# Patient Record
Sex: Female | Born: 1995 | Race: Black or African American | Hispanic: No | Marital: Single | Smoking: Never smoker
Health system: Southern US, Community
[De-identification: ages and names within clinical notes are randomized; demographics above are authoritative.]

## PROBLEM LIST (undated history)

## (undated) DIAGNOSIS — D509 Iron deficiency anemia, unspecified: Secondary | ICD-10-CM

## (undated) HISTORY — PX: CHOLECYSTECTOMY: SHX55

---

## 2018-01-21 ENCOUNTER — Emergency Department (HOSPITAL_COMMUNITY)
Admission: EM | Admit: 2018-01-21 | Discharge: 2018-01-21 | Disposition: A | Discharge status: Home / Self Care | Payer: No Typology Code available for payment source | Attending: Emergency Medicine | Admitting: Emergency Medicine

## 2018-01-21 ENCOUNTER — Emergency Department (HOSPITAL_COMMUNITY): Payer: No Typology Code available for payment source

## 2018-01-21 ENCOUNTER — Encounter (HOSPITAL_COMMUNITY): Payer: Self-pay

## 2018-01-21 ENCOUNTER — Other Ambulatory Visit: Payer: Self-pay

## 2018-01-21 DIAGNOSIS — M549 Dorsalgia, unspecified: Secondary | ICD-10-CM | POA: Diagnosis not present

## 2018-01-21 DIAGNOSIS — M545 Low back pain, unspecified: Secondary | ICD-10-CM

## 2018-01-21 DIAGNOSIS — M542 Cervicalgia: Secondary | ICD-10-CM | POA: Diagnosis present

## 2018-01-21 LAB — POC URINE PREG, ED: Preg Test, Ur: NEGATIVE

## 2018-01-21 MED ORDER — METHOCARBAMOL 500 MG PO TABS: 500.0000 mg | ORAL_TABLET | Freq: Two times a day (BID) | ORAL | 0 refills | Status: DC

## 2018-01-21 MED ORDER — METHOCARBAMOL 500 MG PO TABS
500.0000 mg | ORAL_TABLET | Freq: Once | ORAL | Status: AC
  Administered 2018-01-21: 500 mg via ORAL
  Filled 2018-01-21: qty 1
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
## 2018-01-21 NOTE — Discharge Instructions (Addendum)
All your imaging today was normal.I have prescribed muscle relaxers for your pain, please do not drink or drive while taking this medications as they can make you drowsy. Please follow-up with PCP in 1 week for reevaluation of your symptoms. If you experience any bowel or bladder incontinence, fever, worsening in your symptoms please return to the ED.
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
## 2018-01-28 ENCOUNTER — Encounter (HOSPITAL_COMMUNITY): Payer: Self-pay | Admitting: Emergency Medicine

## 2018-01-28 ENCOUNTER — Ambulatory Visit (HOSPITAL_COMMUNITY)
Admission: EM | Admit: 2018-01-28 | Discharge: 2018-01-28 | Disposition: A | Discharge status: Home / Self Care | Payer: Self-pay | Attending: Family Medicine | Admitting: Family Medicine

## 2018-01-28 DIAGNOSIS — S161XXA Strain of muscle, fascia and tendon at neck level, initial encounter: Secondary | ICD-10-CM

## 2018-01-28 MED ORDER — METHOCARBAMOL 500 MG PO TABS: 500.0000 mg | ORAL_TABLET | Freq: Every evening | ORAL | 0 refills | Status: DC | PRN

## 2018-01-28 MED ORDER — DICLOFENAC SODIUM 75 MG PO TBEC: 75.0000 mg | DELAYED_RELEASE_TABLET | Freq: Two times a day (BID) | ORAL | 0 refills | Status: DC
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
## 2018-02-14 DIAGNOSIS — D509 Iron deficiency anemia, unspecified: Secondary | ICD-10-CM

## 2018-02-14 HISTORY — DX: Iron deficiency anemia, unspecified: D50.9

## 2018-02-22 ENCOUNTER — Observation Stay (HOSPITAL_COMMUNITY)
Admission: EM | Admit: 2018-02-22 | Discharge: 2018-02-24 | Disposition: A | Discharge status: Home / Self Care | Payer: Self-pay | Attending: Internal Medicine | Admitting: Internal Medicine

## 2018-02-22 ENCOUNTER — Encounter (HOSPITAL_COMMUNITY): Payer: Self-pay | Admitting: *Deleted

## 2018-02-22 DIAGNOSIS — N920 Excessive and frequent menstruation with regular cycle: Secondary | ICD-10-CM | POA: Diagnosis present

## 2018-02-22 DIAGNOSIS — R55 Syncope and collapse: Principal | ICD-10-CM | POA: Insufficient documentation

## 2018-02-22 DIAGNOSIS — D500 Iron deficiency anemia secondary to blood loss (chronic): Secondary | ICD-10-CM | POA: Insufficient documentation

## 2018-02-22 DIAGNOSIS — N921 Excessive and frequent menstruation with irregular cycle: Secondary | ICD-10-CM

## 2018-02-22 DIAGNOSIS — Z791 Long term (current) use of non-steroidal anti-inflammatories (NSAID): Secondary | ICD-10-CM | POA: Insufficient documentation

## 2018-02-22 DIAGNOSIS — I4891 Unspecified atrial fibrillation: Secondary | ICD-10-CM | POA: Insufficient documentation

## 2018-02-22 DIAGNOSIS — D509 Iron deficiency anemia, unspecified: Secondary | ICD-10-CM

## 2018-02-22 HISTORY — DX: Iron deficiency anemia, unspecified: D50.9

## 2018-02-22 LAB — CBC
HCT: 22 % — ABNORMAL LOW (ref 36.0–46.0)
HEMOGLOBIN: 5.5 g/dL — AB (ref 12.0–15.0)
MCH: 15.3 pg — ABNORMAL LOW (ref 26.0–34.0)
MCHC: 25 g/dL — ABNORMAL LOW (ref 30.0–36.0)
MCV: 61.3 fL — ABNORMAL LOW (ref 80.0–100.0)
NRBC: 0.3 % — AB (ref 0.0–0.2)
PLATELETS: 290 10*3/uL (ref 150–400)
RBC: 3.59 MIL/uL — AB (ref 3.87–5.11)
RDW: 19.4 % — ABNORMAL HIGH (ref 11.5–15.5)
WBC: 7.3 10*3/uL (ref 4.0–10.5)

## 2018-02-22 LAB — BASIC METABOLIC PANEL
ANION GAP: 9 (ref 5–15)
BUN: 6 mg/dL (ref 6–20)
CALCIUM: 9.2 mg/dL (ref 8.9–10.3)
CO2: 22 mmol/L (ref 22–32)
Chloride: 107 mmol/L (ref 98–111)
Creatinine, Ser: 0.8 mg/dL (ref 0.44–1.00)
Glucose, Bld: 105 mg/dL — ABNORMAL HIGH (ref 70–99)
Potassium: 3.6 mmol/L (ref 3.5–5.1)
SODIUM: 138 mmol/L (ref 135–145)

## 2018-02-22 LAB — RETICULOCYTES
Immature Retic Fract: 17.5 % — ABNORMAL HIGH (ref 2.3–15.9)
RBC.: 3.3 MIL/uL — ABNORMAL LOW (ref 3.87–5.11)
RETIC COUNT ABSOLUTE: 45.2 10*3/uL (ref 19.0–186.0)
Retic Ct Pct: 1.4 % (ref 0.4–3.1)

## 2018-02-22 LAB — FOLATE: Folate: 35.4 ng/mL (ref >5.9)

## 2018-02-22 LAB — IRON AND TIBC
IRON: 8 ug/dL — AB (ref 28–170)
SATURATION RATIOS: 2 % — AB (ref 10.4–31.8)
TIBC: 511 ug/dL — ABNORMAL HIGH (ref 250–450)
UIBC: 503 ug/dL

## 2018-02-22 LAB — PREPARE RBC (CROSSMATCH)

## 2018-02-22 LAB — I-STAT BETA HCG BLOOD, ED (MC, WL, AP ONLY)

## 2018-02-22 LAB — ABO/RH: ABO/RH(D): O POS

## 2018-02-22 LAB — FERRITIN: FERRITIN: 3 ng/mL — AB (ref 11–307)

## 2018-02-22 LAB — VITAMIN B12: Vitamin B-12: 265 pg/mL (ref 180–914)

## 2018-02-22 MED ORDER — ONDANSETRON HCL 4 MG PO TABS: 4.0000 mg | ORAL_TABLET | Freq: Four times a day (QID) | ORAL | Status: DC | PRN

## 2018-02-22 MED ORDER — ACETAMINOPHEN 650 MG RE SUPP: 650.0000 mg | Freq: Four times a day (QID) | RECTAL | Status: DC | PRN

## 2018-02-22 MED ORDER — SODIUM CHLORIDE 0.9 % IV SOLN
10.0000 mL/h | Freq: Once | INTRAVENOUS | Status: AC
  Administered 2018-02-22: 10 mL/h via INTRAVENOUS

## 2018-02-22 MED ORDER — ACETAMINOPHEN 325 MG PO TABS
650.0000 mg | ORAL_TABLET | Freq: Four times a day (QID) | ORAL | Status: DC | PRN
  Administered 2018-02-23 – 2018-02-24 (×2): 650 mg via ORAL
  Filled 2018-02-22 (×2): qty 2

## 2018-02-22 MED ORDER — ONDANSETRON HCL 4 MG/2ML IJ SOLN: 4.0000 mg | Freq: Four times a day (QID) | INTRAMUSCULAR | Status: DC | PRN

## 2018-02-22 NOTE — ED Triage Notes (Signed)
Pt in after syncopal episode, states she has been feeling dizzy with movement for the last few days, today she stood up and walking into a room and passed out, friends say she was unconscious for approx 2 minutes, pt does not remember event but was not confused when she woke up, reports continued intermittent dizziness
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
## 2018-02-22 NOTE — H&P (Signed)
History and Physical    Terry Blackburn ZOX:[id number] DOB: April 19, 1995 DOA: 02/22/2018  PCP: Patient, No Pcp Per  Patient coming from: Home  I have personally briefly reviewed patient's old medical records in [facility]  Chief Complaint: Syncope  HPI: Terry Blackburn is a 22 y.o. female with medical history significant of previously healthy.  Patient presents to the ED with syncope episode that occurred earlier today.   Patient states that she got up and walked out of her bedroom today and blacked out.  She was assisted by a friend who saw her fall.  She had a couple brief episodes of shaking and woke up after about 2 minutes.  She was confused for a brief period of time but quickly came back to her normal self.  Lightheadedness with standing recently, no siezure history, does get SOB with exertion and has had fatigue especially recently.  No melena nor hematochezia.  Does have very heavy periods especially recently, bled for 10 days heavily in month of Nov for example, not currently on period.  Was told she was anemic when giving blood in past but never followed up on this.   ED Course: HGB 5.5, MCV 61.  2u PRBC ordered.   Review of Systems: As per HPI otherwise 10 point review of systems negative.   History reviewed. No pertinent past medical history.  History reviewed. No pertinent surgical history.   reports that she has never smoked. She does not have any smokeless tobacco history on file. She reports that she does not drink alcohol or use drugs.  No Known Allergies  Family History  Problem Relation Age of Onset  . Healthy Mother   . Healthy Father      Prior to Admission medications   Medication Sig Start Date End Date Taking? Authorizing Provider  ibuprofen (ADVIL,MOTRIN) 800 MG tablet Take 800 mg by mouth every 8 (eight) hours as needed (back pain).   Yes [provider]  Multiple Vitamins-Minerals (HAIR/SKIN/NAILS/BIOTIN) TABS Take 1 tablet by mouth  daily.   Yes [provider]    Physical Exam: Vitals:   02/22/18 2000 02/22/18 2115 02/22/18 2131 02/22/18 2146  BP: 118/78 (!) 120/96 108/68 111/67  Pulse: 80 76 77 79  Resp: (!) 25 14 (!) 21 20  Temp:   98.1 F (36.7 C) 98.9 F (37.2 C)  TempSrc:   Oral Oral  SpO2: 100% 100%  100%    Constitutional: NAD, calm, comfortable Eyes: PERRL, lids and conjunctivae normal ENMT: Mucous membranes are moist. Posterior pharynx clear of any exudate or lesions.Normal dentition.  Neck: normal, supple, no masses, no thyromegaly Respiratory: clear to auscultation bilaterally, no wheezing, no crackles. Normal respiratory effort. No accessory muscle use.  Cardiovascular: Regular rate and rhythm, no murmurs / rubs / gallops. No extremity edema. 2+ pedal pulses. No carotid bruits.  Abdomen: no tenderness, no masses palpated. No hepatosplenomegaly. Bowel sounds positive.  Musculoskeletal: no clubbing / cyanosis. No joint deformity upper and lower extremities. Good ROM, no contractures. Normal muscle tone.  Skin: no rashes, lesions, ulcers. No induration Neurologic: CN 2-12 grossly intact. Sensation intact, DTR normal. Strength 5/5 in all 4.  Psychiatric: Normal judgment and insight. Alert and oriented x 3. Normal mood.    Labs on Admission: I have personally reviewed following labs and imaging studies  CBC: Recent Labs  Lab 02/22/18 1842  WBC 7.3  HGB 5.5*  HCT 22.0*  MCV 61.3*  PLT 290   Basic Metabolic Panel: Recent Labs  Lab 02/22/18 1842  NA 138  K 3.6  CL 107  CO2 22  GLUCOSE 105*  BUN 6  CREATININE 0.80  CALCIUM 9.2   GFR: CrCl cannot be calculated (Unknown ideal weight.). Liver Function Tests: No results for input(s): AST, ALT, ALKPHOS, BILITOT, PROT, ALBUMIN in the last 168 hours. No results for input(s): LIPASE, AMYLASE in the last 168 hours. No results for input(s): AMMONIA in the last 168 hours. Coagulation Profile: No results for input(s): INR, PROTIME in  the last 168 hours. Cardiac Enzymes: No results for input(s): CKTOTAL, CKMB, CKMBINDEX, TROPONINI in the last 168 hours. BNP (last 3 results) No results for input(s): PROBNP in the last 8760 hours. HbA1C: No results for input(s): HGBA1C in the last 72 hours. CBG: No results for input(s): GLUCAP in the last 168 hours. Lipid Profile: No results for input(s): CHOL, HDL, LDLCALC, TRIG, CHOLHDL, LDLDIRECT in the last 72 hours. Thyroid Function Tests: No results for input(s): TSH, T4TOTAL, FREET4, T3FREE, THYROIDAB in the last 72 hours. Anemia Panel: Recent Labs    02/22/18 2120  RETICCTPCT 1.4   Urine analysis: No results found for: COLORURINE, APPEARANCEUR, LABSPEC, PHURINE, GLUCOSEU, HGBUR, BILIRUBINUR, KETONESUR, PROTEINUR, UROBILINOGEN, NITRITE, LEUKOCYTESUR  Radiological Exams on Admission: No results found.  EKG: Independently reviewed.  Assessment/Plan Principal Problem:   Syncope Active Problems:   Menorrhagia   Iron deficiency anemia due to chronic blood loss    1. Syncope - suspect this secondary to severe anemia with HGB 5.5 1. Transfuse 2u PRBC 2. Tele monitor 3. No abn heart sounds on my exam, will hold of on ordering 2d echo for the moment given the very high likely hood of syncope relation to anemia. 4. Doubt seizure given above, no neuro deficits, will hold off on ordering CNS imaging as well. 2. Anemia - probably iron def due to chronic blood loss 1. Anemia pnl pending to confirm 2. Needs GYN follow up for menorrhagia  DVT prophylaxis: SCDs Code Status: Full Family Communication: No family in room Disposition Plan: Home after admit Consults called: None Admission status: Place in obs     Carmelle Bamberg, Heywood IlesJARED M. DO [facility] Pager [phone] Only works nights!  If 7AM-7PM, please contact the primary day team physician taking care of patient  [email or web address] Password [email or web address]  02/22/2018, 9:52 PM

## 2018-02-23 ENCOUNTER — Other Ambulatory Visit: Payer: Self-pay

## 2018-02-23 ENCOUNTER — Encounter (HOSPITAL_COMMUNITY): Payer: Self-pay | Admitting: General Practice

## 2018-02-23 DIAGNOSIS — D500 Iron deficiency anemia secondary to blood loss (chronic): Secondary | ICD-10-CM

## 2018-02-23 LAB — PREPARE RBC (CROSSMATCH)

## 2018-02-23 LAB — CBC
HCT: 25.7 % — ABNORMAL LOW (ref 36.0–46.0)
Hemoglobin: 7.1 g/dL — ABNORMAL LOW (ref 12.0–15.0)
MCH: 18.7 pg — ABNORMAL LOW (ref 26.0–34.0)
MCHC: 27.6 g/dL — ABNORMAL LOW (ref 30.0–36.0)
MCV: 67.6 fL — ABNORMAL LOW (ref 80.0–100.0)
Platelets: 232 10*3/uL (ref 150–400)
RBC: 3.8 MIL/uL — ABNORMAL LOW (ref 3.87–5.11)
RDW: 26.9 % — AB (ref 11.5–15.5)
WBC: 6.2 10*3/uL (ref 4.0–10.5)
nRBC: 0.3 % — ABNORMAL HIGH (ref 0.0–0.2)

## 2018-02-23 LAB — HIV ANTIBODY (ROUTINE TESTING W REFLEX): HIV Screen 4th Generation wRfx: NONREACTIVE

## 2018-02-23 LAB — HEMOGLOBIN AND HEMATOCRIT, BLOOD
HCT: 29.4 % — ABNORMAL LOW (ref 36.0–46.0)
HEMATOCRIT: 27.6 % — AB (ref 36.0–46.0)
HEMOGLOBIN: 7.5 g/dL — AB (ref 12.0–15.0)
Hemoglobin: 8.4 g/dL — ABNORMAL LOW (ref 12.0–15.0)

## 2018-02-23 MED ORDER — SODIUM CHLORIDE 0.9 % IV SOLN
510.0000 mg | Freq: Once | INTRAVENOUS | Status: AC
  Administered 2018-02-23: 510 mg via INTRAVENOUS
  Filled 2018-02-23: qty 17

## 2018-02-23 MED ORDER — SODIUM CHLORIDE 0.9% IV SOLUTION
Freq: Once | INTRAVENOUS | Status: AC
  Administered 2018-02-23: 15:00:00 via INTRAVENOUS

## 2018-02-23 NOTE — Progress Notes (Signed)
Progress Note    Terry Blackburn  ZOX:[id number] DOB: 03-16-1996  DOA: 02/22/2018 PCP: Patient, No Pcp Per    Brief Narrative:     Medical records reviewed and are as summarized below:  Terry Blackburn is an 22 y.o. female with medical history significant of previously healthy.  Patient presents to the ED with syncope episode that occurred earlier today.  Patient states that she got up and walked out of her bedroom today and blacked out. She was assisted by a friend who saw her fall. She had a couple brief episodes of shaking and woke up after about 2 minutes. She was confused for a brief period of time but quickly came back to her normal self.  Lightheadedness with standing recently, no siezure history, does get SOB with exertion and has had fatigue especially recently.  No melena nor hematochezia.  Does have very heavy periods especially recently, bled for 10 days heavily in month of Nov for example, not currently on period.  Was told she was anemic when giving blood in past but never followed up on this.  Assessment/Plan:   Principal Problem:   Syncope Active Problems:   Menorrhagia   Iron deficiency anemia due to chronic blood loss  Syncope - suspect this secondary to severe anemia with HGB 5.5 S/p 2 units with improvement to 7.5-- still symptomatic so will give another unit S/p 1 dose of IV Fe -will need PO Fe at d/c  Anemia - probably iron def due to menorrhagia Getting 3rd unit -s/p IV fe -may need megace upon d/c if starts period -has appointment with GYN on 12/20   Family Communication/Anticipated D/C date and plan/Code Status   DVT prophylaxis: scd Code Status: Full Code.  Family Communication:  Disposition Plan: home in AM- getting another PRBC today-- may need megace as due to start period tomorrow   Medical Consultants:    None.    Subjective:   C/o headache and SOB with exertion  Objective:    Vitals:   02/23/18 0500 02/23/18 0515  02/23/18 0816 02/23/18 0949  BP: 103/68 106/61 110/68   Pulse: 74 70 61   Resp: 18 18 20    Temp:   98.1 F (36.7 C)   TempSrc:   Oral   SpO2: 98% 95% 100%   Weight:    83.9 kg  Height:    5\' 8"  (1.727 m)    Intake/Output Summary (Last 24 hours) at 02/23/2018 1350 Last data filed at 02/23/2018 [id number] Gross per 24 hour  Intake 629 ml  Output -  Net 629 ml   Filed Weights   02/23/18 0949  Weight: 83.9 kg    Exam: In bed, NAD rrr No wheezing  Data Reviewed:   I have personally reviewed following labs and imaging studies:  Labs: Labs show the following:   Basic Metabolic Panel: Recent Labs  Lab 02/22/18 1842  NA 138  K 3.6  CL 107  CO2 22  GLUCOSE 105*  BUN 6  CREATININE 0.80  CALCIUM 9.2   GFR Estimated Creatinine Clearance: 126.3 mL/min (by C-G formula based on SCr of 0.8 mg/dL). Liver Function Tests: No results for input(s): AST, ALT, ALKPHOS, BILITOT, PROT, ALBUMIN in the last 168 hours. No results for input(s): LIPASE, AMYLASE in the last 168 hours. No results for input(s): AMMONIA in the last 168 hours. Coagulation profile No results for input(s): INR, PROTIME in the last 168 hours.  CBC: Recent Labs  Lab 02/22/18 1842 02/23/18 0500  02/23/18 1130  WBC 7.3 6.2  --   HGB 5.5* 7.1* 7.5*  HCT 22.0* 25.7* 27.6*  MCV 61.3* 67.6*  --   PLT 290 232  --    Cardiac Enzymes: No results for input(s): CKTOTAL, CKMB, CKMBINDEX, TROPONINI in the last 168 hours. BNP (last 3 results) No results for input(s): PROBNP in the last 8760 hours. CBG: No results for input(s): GLUCAP in the last 168 hours. D-Dimer: No results for input(s): DDIMER in the last 72 hours. Hgb A1c: No results for input(s): HGBA1C in the last 72 hours. Lipid Profile: No results for input(s): CHOL, HDL, LDLCALC, TRIG, CHOLHDL, LDLDIRECT in the last 72 hours. Thyroid function studies: No results for input(s): TSH, T4TOTAL, T3FREE, THYROIDAB in the last 72 hours.  Invalid input(s):  FREET3 Anemia work up: Recent Labs    02/22/18 2120  VITAMINB12 265  FOLATE 35.4  FERRITIN 3*  TIBC 511*  IRON 8*  RETICCTPCT 1.4   Sepsis Labs: Recent Labs  Lab 02/22/18 1842 02/23/18 0500  WBC 7.3 6.2    Microbiology No results found for this or any previous visit (from the past 240 hour(s)).  Procedures and diagnostic studies:  No results found.  Medications:   . sodium chloride   Intravenous Once   Continuous Infusions:   LOS: 0 days   Joseph Art  [facility]   *Please refer to [email or web address], password [email or web address] to get updated schedule on who will round on this patient, as hospitalists switch teams weekly. If 7PM-7AM, please contact night-coverage at [email or web address], password [email or web address] for any overnight needs.  02/23/2018, 1:50 PM

## 2018-02-23 NOTE — Care Management Note (Signed)
Case Management Note  Patient Details  Name: Lashanta Trela MRN: [id number] Date of Birth: 03/27/1995  Subjective/Objective:  22 yo female presented with syncope and severe anemia.                 Action/Plan: CM met with patient to discuss transitional needs. Patient lives at home, independent with ADLs PTA. Patient reports having no established GYN, but agreeable to CM arranging a f/u appointment. Gynecology appt arranged at: [facility] 03/05/18 @ 0915; [facility] for Rx needs and for establishing a local PCP; AVS updated. No further needs from CM.   Expected Discharge Date:  02/24/18               Expected Discharge Plan:  Home/Self Care  In-House Referral:  NA  Discharge planning Services  CM Consult, Follow-up appt scheduled, Medication Assistance  Post Acute Care Choice:  NA Choice offered to:  NA  DME Arranged:  N/A DME Agency:  NA  HH Arranged:  NA HH Agency:  NA  Status of Service:  Completed, signed off  If discussed at Long Length of Stay Meetings, dates discussed:    Additional Comments:  Natalie Gay RN, BSN, NCM-BC, ACM-RN [phone] 02/23/2018, 11:31 AM  

## 2018-02-23 NOTE — Progress Notes (Signed)
Here with anemia from heavy periods-- Fe def Plan: IV fe x 1, H/H at 11 (if still low, may need another PRBC), [facility]

## 2018-02-24 LAB — TYPE AND SCREEN
ABO/RH(D): O POS
Antibody Screen: NEGATIVE
UNIT DIVISION: 0
Unit division: 0
Unit division: 0

## 2018-02-24 LAB — BPAM RBC
BLOOD PRODUCT EXPIRATION DATE: 202001082359
Blood Product Expiration Date: 202001082359
Blood Product Expiration Date: 202001082359
ISSUE DATE / TIME: 201912092121
ISSUE DATE / TIME: 201912100144
ISSUE DATE / TIME: 201912101520
UNIT TYPE AND RH: 5100
Unit Type and Rh: 5100
Unit Type and Rh: 5100

## 2018-02-24 MED ORDER — FERROUS SULFATE 325 (65 FE) MG PO TBEC: 325.0000 mg | DELAYED_RELEASE_TABLET | Freq: Two times a day (BID) | ORAL | 3 refills | Status: DC

## 2018-02-24 NOTE — Discharge Summary (Signed)
Physician Discharge Summary  Terry Blackburn ZOX:[id number] DOB: 1995/06/03 DOA: 02/22/2018  PCP: Patient, No Pcp Per  Admit date: 02/22/2018  Discharge date: 02/24/2018  Admitted From: Home  Disposition:  Home  Discharge Condition: Stable  CODE STATUS:  Full  Brief/Interim Summary:  Terry AlphaKaythelia Bushnell is a 22 y.o. female with no significant past medical history except for vaginal bleeding presented to the hospital with syncope.  Patient got up and walked out of her bedroom today and blacked out and had a witnessed fall.  Patient does meet to have heavy periods especially recently had D&C done outpatient with gynecology.  Patient is a ED patient had a hemoglobin of 5.5.  She received a total of 3 units of packed RBC appropriate rise in her hemoglobin.  She also received 1 dose of IV iron.  Feels much better with 3 units of blood transfusion.  She does have an appointment to follow-up with her gynecology as outpatient and was advised to discuss about need for blood transfusion.  Patient was advised to continue taking iron on discharge.  Patient was finally considered stable for disposition home.  Discharge Diagnoses:  Principal Problem:   Syncope, likely secondary to severe blood loss anemia Active Problems:   Menorrhagia   Iron deficiency anemia due to chronic blood loss   Discharge Instructions  Discharge Instructions    Diet - low sodium heart healthy   Complete by:  As directed    Discharge instructions   Complete by:  As directed    Follow up with Gynecology as scheduled. Continue taking iron as prescribed.   Increase activity slowly   Complete by:  As directed      Allergies as of 02/24/2018   No Known Allergies     Medication List    TAKE these medications   ferrous sulfate 325 (65 FE) MG EC tablet Take 1 tablet (325 mg total) by mouth 2 (two) times daily.   HAIR/SKIN/NAILS/BIOTIN Tabs Take 1 tablet by mouth daily.   ibuprofen 800 MG tablet Commonly known as:   ADVIL,MOTRIN Take 800 mg by mouth every 8 (eight) hours as needed (back pain).      Follow-up Information    [facility]   Why:  at 9:15am for you gynecology appointment. Contact information: [address] [place] [phone]       [facility]. Call.   Why:  for your prescription needs; medications are $4-$10. Contact on Monday to schedule a hospital follow-up appointment. Contact information: [address] [place] [phone]         No Known Allergies  Consultations:  none   Procedures/Studies: Blood transfusion  Subjective:  Denies any dizziness, lightheadedness, shortness of breath,, chest pain or palpitation   Discharge Exam: Vitals:   02/24/18 0045 02/24/18 0643  BP: (!) 102/57 95/69  Pulse: 65 77  Resp: 18 18  Temp: 98.3 F (36.8 C) 97.8 F (36.6 C)  SpO2: 98% 100%   Vitals:   02/23/18 1723 02/23/18 2051 02/24/18 0045 02/24/18 0643  BP: 111/66 100/83 (!) 102/57 95/69  Pulse: 77 66 65 77  Resp: (!) 24 20 18 18   Temp: 98.2 F (36.8 C) 98.4 F (36.9 C) 98.3 F (36.8 C) 97.8 F (36.6 C)  TempSrc: Oral Oral Oral Oral  SpO2: 100% 100% 98% 100%  Weight:      Height:        General:  Pt is alert, awake, not in acute distress.  Mild pallor noted. Cardiovascular: RRR, S1/S2 +, no rubs, no gallops Respiratory: CTA bilaterally, no wheezing, no rhonchi Abdominal: Soft, NT, ND, bowel sounds + CNS: non focal. Extremities: no edema, no cyanosis  The results of significant diagnostics from this hospitalization (including imaging, microbiology, ancillary and laboratory) are listed below for reference.    Microbiology: No results found for this or any previous visit (from the past 240 hour(s)).   Labs: BNP (last 3 results) No results for input(s): BNP in the last 8760 hours. Basic Metabolic Panel: Recent Labs   Lab 02/22/18 1842  NA 138  K 3.6  CL 107  CO2 22  GLUCOSE 105*  BUN 6  CREATININE 0.80  CALCIUM 9.2   Liver Function Tests: No results for input(s): AST, ALT, ALKPHOS, BILITOT, PROT, ALBUMIN in the last 168 hours. No results for input(s): LIPASE, AMYLASE in the last 168 hours. No results for input(s): AMMONIA in the last 168 hours. CBC: Recent Labs  Lab 02/22/18 1842 02/23/18 0500 02/23/18 1130 02/23/18 2110  WBC 7.3 6.2  --   --   HGB 5.5* 7.1* 7.5* 8.4*  HCT 22.0* 25.7* 27.6* 29.4*  MCV 61.3* 67.6*  --   --   PLT 290 232  --   --    Cardiac Enzymes: No results for input(s): CKTOTAL, CKMB, CKMBINDEX, TROPONINI in the last 168 hours. BNP: Invalid input(s): POCBNP CBG: No results for input(s): GLUCAP in the last 168 hours. D-Dimer No results for input(s): DDIMER in the last 72 hours. Hgb A1c No results for input(s): HGBA1C in the last 72 hours. Lipid Profile No results for input(s): CHOL, HDL, LDLCALC, TRIG, CHOLHDL, LDLDIRECT in the last 72 hours. Thyroid function studies No results for input(s): TSH, T4TOTAL, T3FREE, THYROIDAB in the last 72 hours.  Invalid input(s): FREET3 Anemia work up [employer]    02/22/18 2120  VITAMINB12 265  FOLATE 35.4  FERRITIN 3*  TIBC 511*  IRON 8*  RETICCTPCT 1.4   Urinalysis No results found for: COLORURINE, APPEARANCEUR, LABSPEC, PHURINE, GLUCOSEU, HGBUR, BILIRUBINUR, KETONESUR, PROTEINUR, UROBILINOGEN, NITRITE, LEUKOCYTESUR Sepsis Labs Invalid input(s): PROCALCITONIN,  WBC,  LACTICIDVEN Microbiology No results found for this or any previous visit (from the past 240 hour(s)).  Please note: You were cared for by a hospitalist during your hospital stay. Once you are discharged, your primary care physician will handle any further medical issues.   Time coordinating discharge: 40 minutes  SIGNED:  Joycelyn Das, MD  [facility] 02/24/2018, 9:05 AM

## 2018-03-05 ENCOUNTER — Encounter: Payer: Self-pay | Admitting: Obstetrics & Gynecology

## 2018-09-04 ENCOUNTER — Other Ambulatory Visit: Payer: Self-pay

## 2018-09-04 ENCOUNTER — Ambulatory Visit (HOSPITAL_COMMUNITY)
Admission: EM | Admit: 2018-09-04 | Discharge: 2018-09-04 | Disposition: A | Discharge status: Home / Self Care | Payer: Self-pay | Attending: Internal Medicine | Admitting: Internal Medicine

## 2018-09-04 ENCOUNTER — Encounter (HOSPITAL_COMMUNITY): Payer: Self-pay

## 2018-09-04 DIAGNOSIS — N926 Irregular menstruation, unspecified: Secondary | ICD-10-CM

## 2018-09-04 LAB — POCT PREGNANCY, URINE: Preg Test, Ur: NEGATIVE

## 2018-09-04 MED ORDER — MELOXICAM 7.5 MG PO TABS: 7.5000 mg | ORAL_TABLET | Freq: Every day | ORAL | 0 refills | Status: DC

## 2018-09-04 NOTE — ED Provider Notes (Signed)
[facility]    CSN: [id number] Arrival date & time: 09/04/18  1253     History   Chief Complaint Chief Complaint  Patient presents with   excressive bleeding    HPI Terry Blackburn is a 23 y.o. female with history of irregular menses presenting for prolonged menstrual bleeding.  Patient states that she has been seeing [facility]: Receiving Depo-Provera injections through their office.  Patient states her last Depo shot was in May, same time of her last period.  Patient states that she has had spotting since then.  [facility] to her on estradiol, though this gave her migraine so she discontinued it.  Patient states that it was light bleeding during this time, though over the last 3 days she has had heavier bleeding.  States that she has had to change her tampon hourly.  Patient also endorsing breast tenderness and menstrual cramps which patient feels are near her baseline.  Patient takes ibuprofen for her cramps, though has not had as much relief..  Patient does have a history of iron deficiency anemia and was hospitalized in December 2019 for blood transfusion with IV iron.  Patient states she handled blood transfusion well, has not had adverse outcomes or readmission.  She states she was off of her iron tabs for a long time because it upset her stomach, though just recently resumed them.  States that she started last week, currently taking 2-3 times a week.  States despite prolonged/heavy bleeding she has not had any headaches, lightheadedness, presyncopal events, orthostasis, change in vision, weakness, fatigue.  Patient denies easy bruising bleeding, history of coagulopathy, does not routinely take aspirin or NSAIDs.  Patient denies chest pain, palpitations, shortness of breath, abdominal pain, vaginal pain, hematochezia, melena, hematuria, dysuria, urinary frequency.  Patient denies history of fibroids, endometriosis, gynecological malignancy.  Patient states that  she is currently sexually active, does not routinely wear condoms.   Past Medical History:  Diagnosis Date   Iron deficiency anemia 02/2018    Patient Active Problem List   Diagnosis Date Noted   Menorrhagia 02/22/2018   Iron deficiency anemia due to chronic blood loss 02/22/2018   Syncope 02/22/2018    Past Surgical History:  Procedure Laterality Date   CHOLECYSTECTOMY      OB History   No obstetric history on file.      Home Medications    Prior to Admission medications   Medication Sig Start Date End Date Taking? Authorizing Provider  ferrous sulfate 325 (65 FE) MG EC tablet Take 1 tablet (325 mg total) by mouth 2 (two) times daily. 02/24/18 02/24/19  Pokhrel, Rebekah ChesterfieldLaxman, MD  ibuprofen (ADVIL,MOTRIN) 800 MG tablet Take 800 mg by mouth every 8 (eight) hours as needed (back pain).    [provider]  meloxicam (MOBIC) 7.5 MG tablet Take 1 tablet (7.5 mg total) by mouth daily. 09/04/18   Hall-Potvin, [place], PA-C  Multiple Vitamins-Minerals (HAIR/SKIN/NAILS/BIOTIN) TABS Take 1 tablet by mouth daily.    [provider]    Family History Family History  Problem Relation Age of Onset   Healthy Mother    Healthy Father     Social History Social History   Tobacco Use   Smoking status: Never Smoker   Smokeless tobacco: Never Used  Substance Use Topics   Alcohol use: Never    Frequency: Never   Drug use: Never     Allergies   Patient has no known allergies.   Review of Systems As per  HPI   Physical Exam Triage Vital Signs ED Triage Vitals  Enc Vitals Group     BP 09/04/18 1334 126/74     Pulse Rate 09/04/18 1334 69     Resp 09/04/18 1334 16     Temp 09/04/18 1334 98 F (36.7 C)     Temp src --      SpO2 09/04/18 1334 100 %     Weight 09/04/18 1335 190 lb (86.2 kg)     Height --      Head Circumference --      Peak Flow --      Pain Score 09/04/18 1335 10     Pain Loc --      Pain Edu? --      Excl. in GC? --    No  data found.  Updated Vital Signs BP 126/74 (BP Location: Right Arm)    Pulse 69    Temp 98 F (36.7 C)    Resp 16    Wt 190 lb (86.2 kg)    LMP 09/04/2018    SpO2 100%    BMI 28.89 kg/m   Visual Acuity Right Eye Distance:   Left Eye Distance:   Bilateral Distance:    Right Eye Near:   Left Eye Near:    Bilateral Near:     Physical Exam Constitutional:      General: She is not in acute distress. HENT:     Head: Normocephalic and atraumatic.     Mouth/Throat:     Mouth: Mucous membranes are moist.     Pharynx: Oropharynx is clear.  Eyes:     General: No scleral icterus.    Extraocular Movements: Extraocular movements intact.     Conjunctiva/sclera: Conjunctivae normal.     Pupils: Pupils are equal, round, and reactive to light.     Comments: Negative conjunctival pallor  Cardiovascular:     Rate and Rhythm: Normal rate.  Pulmonary:     Effort: Pulmonary effort is normal.  Abdominal:     General: Bowel sounds are normal. There is no distension.     Palpations: Abdomen is soft.     Tenderness: There is no abdominal tenderness. There is no right CVA tenderness, left CVA tenderness or guarding.  Genitourinary:    Comments: Patient declined, self-swab performed Skin:    General: Skin is warm.     Capillary Refill: Capillary refill takes less than 2 seconds.     Coloration: Skin is not jaundiced or pale.     Findings: No bruising or erythema.  Neurological:     General: No focal deficit present.     Mental Status: She is alert and oriented to person, place, and time.  Psychiatric:        Mood and Affect: Mood normal.        Thought Content: Thought content normal.      UC Treatments / Results  Labs (all labs ordered are listed, but only abnormal results are displayed) Labs Reviewed  POC URINE PREG, ED  POCT PREGNANCY, URINE    EKG None  Radiology No results found.  Procedures Procedures (including critical care time)  Medications Ordered in  UC Medications - No data to display  Initial Impression / Assessment and Plan / UC Course  I have reviewed the triage vital signs and the nursing notes.  Pertinent labs & imaging results that were available during my care of the patient were reviewed by me and considered in my medical  decision making (see chart for details).     23 year old female with history of irregular menses and iron deficiency anemia status post blood transfusion in December 2019 presenting for longed vaginal bleeding.  Patient's history and physical exam are reassuring and patient is hemodynamically stable.  Will provide meloxicam for additional cramping relief.  Pregnancy test negative in office.  Discussed importance of routine OB outpatient care as well as health maintenance with PCP.  Discussed case with Lavell Anchors, NP, who is willing to take patient on.  Patient to call Monday to schedule appointment.  Return precautions discussed, patient verbalized understanding. Final Clinical Impressions(s) / UC Diagnoses   Final diagnoses:  Irregular menstrual cycle     Discharge Instructions     Patient to take 1 iron tablet daily for your iron deficiency anemia. If you are unable to tolerate this due to GI upset, try taking once every other day. Follow-up with Lavell Anchors, NP for routine care and further evaluation. Return for evaluation if you become lightheaded, dizzy, have palpitations, shortness of breath, loss of consciousness.    ED Prescriptions    Medication Sig Dispense Auth. Provider   meloxicam (MOBIC) 7.5 MG tablet Take 1 tablet (7.5 mg total) by mouth daily. 10 tablet Hall-Potvin, [place], PA-C     Controlled Substance Prescriptions [place] Controlled Substance Registry consulted? Not Applicable   Quincy Sheehan, [place] 09/05/18 4562

## 2018-09-04 NOTE — ED Triage Notes (Signed)
Pt states she has excressive bleeding pt was given Estradiol because she was having irregular periods.

## 2018-09-04 NOTE — Discharge Instructions (Signed)
Patient to take 1 iron tablet daily for your iron deficiency anemia. If you are unable to tolerate this due to GI upset, try taking once every other day. Follow-up with Lavell Anchors, NP for routine care and further evaluation. Return for evaluation if you become lightheaded, dizzy, have palpitations, shortness of breath, loss of consciousness.

## 2018-09-05 ENCOUNTER — Encounter (HOSPITAL_COMMUNITY): Payer: Self-pay | Admitting: Emergency Medicine

## 2018-10-13 ENCOUNTER — Encounter: Payer: Self-pay | Admitting: Family Medicine

## 2018-10-13 NOTE — Progress Notes (Signed)
Patient did not keep appointment today. She may call to reschedule.  

## 2018-10-14 ENCOUNTER — Telehealth: Payer: Self-pay | Admitting: Hematology

## 2018-10-14 NOTE — Telephone Encounter (Signed)
Received a new hem referral from Dr. Gilford Raid at [facility] for anemia. Pt has been cld and scheduled to see Dr. Irene Limbo on 8/18 at 10am. Ms. Tilmon is aware to arrive 20 minutes early.

## 2018-11-02 ENCOUNTER — Encounter: Payer: Medicaid Other | Admitting: Hematology

## 2018-11-08 ENCOUNTER — Encounter: Payer: Self-pay | Admitting: Emergency Medicine

## 2018-11-08 ENCOUNTER — Other Ambulatory Visit: Payer: Self-pay

## 2018-11-08 ENCOUNTER — Ambulatory Visit
Admission: EM | Admit: 2018-11-08 | Discharge: 2018-11-08 | Disposition: A | Discharge status: Home / Self Care | Payer: Medicaid Other | Attending: Emergency Medicine | Admitting: Emergency Medicine

## 2018-11-08 DIAGNOSIS — B9689 Other specified bacterial agents as the cause of diseases classified elsewhere: Secondary | ICD-10-CM

## 2018-11-08 DIAGNOSIS — N3001 Acute cystitis with hematuria: Secondary | ICD-10-CM | POA: Insufficient documentation

## 2018-11-08 LAB — POCT URINALYSIS DIP (MANUAL ENTRY)
Bilirubin, UA: NEGATIVE
Glucose, UA: NEGATIVE mg/dL
Ketones, POC UA: NEGATIVE mg/dL
Nitrite, UA: NEGATIVE
Protein Ur, POC: NEGATIVE mg/dL
Spec Grav, UA: 1.015 (ref 1.010–1.025)
Urobilinogen, UA: 0.2 E.U./dL
pH, UA: 7 (ref 5.0–8.0)

## 2018-11-08 LAB — POCT URINE PREGNANCY: Preg Test, Ur: NEGATIVE

## 2018-11-08 MED ORDER — CEPHALEXIN 500 MG PO CAPS: 500.0000 mg | ORAL_CAPSULE | Freq: Two times a day (BID) | ORAL | 0 refills | Status: AC

## 2018-11-08 NOTE — ED Triage Notes (Signed)
Pt presents to [facility] for assessment of burning with urination x 4 days, and spotting in the morning x 2 days.  Patient wants to be checked for STDs.

## 2018-11-08 NOTE — Discharge Instructions (Addendum)
Antibiotic as prescribed. We will call you with your STD results if positive, and treat if indicated. Return for worsening symptoms, fever, back or belly pain.

## 2018-11-08 NOTE — ED Notes (Signed)
Patient able to ambulate independently  

## 2018-11-08 NOTE — ED Provider Notes (Signed)
[facility]    CSN: [id number] Arrival date & time: 11/08/18  1919      History   Chief Complaint Chief Complaint  Patient presents with  . Dysuria  . Vaginal Discharge    HPI Terry Blackburn is a 23 y.o. female history of BV presenting for burning with urination for the last 4 days, light vaginal spotting last 2 days.  Patient also requesting STD check: Denies vaginal discharge, pelvic or vaginal pain, anogenital lesions.  Currently sexually active with men, not routinely using condoms.   Past Medical History:  Diagnosis Date  . Iron deficiency anemia 02/2018    Patient Active Problem List   Diagnosis Date Noted  . Menorrhagia 02/22/2018  . Iron deficiency anemia due to chronic blood loss 02/22/2018  . Syncope 02/22/2018    Past Surgical History:  Procedure Laterality Date  . CHOLECYSTECTOMY      OB History   No obstetric history on file.      Home Medications    Prior to Admission medications   Medication Sig Start Date End Date Taking? Authorizing Provider  cephALEXin (KEFLEX) 500 MG capsule Take 1 capsule (500 mg total) by mouth 2 (two) times daily for 3 days. 11/08/18 11/11/18  Hall-Potvin, [place], PA-C  ferrous sulfate 325 (65 FE) MG EC tablet Take 1 tablet (325 mg total) by mouth 2 (two) times daily. 02/24/18 02/24/19  Pokhrel, Corrie Mckusick, MD  ibuprofen (ADVIL,MOTRIN) 800 MG tablet Take 800 mg by mouth every 8 (eight) hours as needed (back pain).    [provider]  Multiple Vitamins-Minerals (HAIR/SKIN/NAILS/BIOTIN) TABS Take 1 tablet by mouth daily.    [provider]    Family History Family History  Problem Relation Age of Onset  . Healthy Mother   . Healthy Father     Social History Social History   Tobacco Use  . Smoking status: Never Smoker  . Smokeless tobacco: Never Used  Substance Use Topics  . Alcohol use: Never    Frequency: Never  . Drug use: Never     Allergies   Patient has no known allergies.    Review of Systems Review of Systems  Constitutional: Negative for fatigue and fever.  Respiratory: Negative for cough and shortness of breath.   Cardiovascular: Negative for chest pain and palpitations.  Gastrointestinal: Negative for abdominal pain, constipation and diarrhea.  Genitourinary: Positive for dysuria and frequency. Negative for flank pain, genital sores, hematuria, pelvic pain, urgency, vaginal bleeding, vaginal discharge and vaginal pain.  Musculoskeletal: Negative for back pain and myalgias.     Physical Exam Triage Vital Signs ED Triage Vitals  Enc Vitals Group     BP 11/08/18 1935 116/81     Pulse Rate 11/08/18 1935 72     Resp 11/08/18 1935 16     Temp 11/08/18 1935 98.9 F (37.2 C)     Temp Source 11/08/18 1935 Oral     SpO2 11/08/18 1935 99 %     Weight --      Height --      Head Circumference --      Peak Flow --      Pain Score 11/08/18 1937 6     Pain Loc --      Pain Edu? --      Excl. in [place]? --    No data found.  Updated Vital Signs BP 116/81 (BP Location: Left Arm)   Pulse 72   Temp 98.9 F (37.2 C) (Oral)  Resp 16   SpO2 99%   Visual Acuity Right Eye Distance:   Left Eye Distance:   Bilateral Distance:    Right Eye Near:   Left Eye Near:    Bilateral Near:     Physical Exam Constitutional:      General: She is not in acute distress. HENT:     Head: Normocephalic and atraumatic.  Eyes:     General: No scleral icterus.    Pupils: Pupils are equal, round, and reactive to light.  Cardiovascular:     Rate and Rhythm: Normal rate.  Pulmonary:     Effort: Pulmonary effort is normal.  Abdominal:     General: Bowel sounds are normal.     Palpations: Abdomen is soft.     Tenderness: There is no abdominal tenderness. There is no right CVA tenderness, left CVA tenderness or guarding.  Genitourinary:    Comments: Patient declined, self-swab performed Skin:    Coloration: Skin is not jaundiced or pale.  Neurological:      Mental Status: She is alert and oriented to person, place, and time.      UC Treatments / Results  Labs (all labs ordered are listed, but only abnormal results are displayed) Labs Reviewed  POCT URINALYSIS DIP (MANUAL ENTRY) - Abnormal; Notable for the following components:      Result Value   Blood, UA small (*)    Leukocytes, UA Trace (*)    All other components within normal limits  POCT URINE PREGNANCY - Normal  URINE CULTURE  CERVICOVAGINAL ANCILLARY ONLY    EKG   Radiology No results found.  Procedures Procedures (including critical care time)  Medications Ordered in UC Medications - No data to display  Initial Impression / Assessment and Plan / UC Course  I have reviewed the triage vital signs and the nursing notes.  Pertinent labs & imaging results that were available during my care of the patient were reviewed by me and considered in my medical decision making (see chart for details).     1.  Acute cystitis with hematuria POCT urinalysis done in office, reviewed by me: Positive for trace leuks, small blood.  Patient to track her menstrual cycle.  Pregnancy test reviewed by me: Negative.  Will treat for simple cystitis with 3-day course of Keflex.  STD panel pending: No in office treatment at this time due to lack of symptoms/known exposure.  Return precautions discussed, patient verbalized understanding and is agreeable to plan. Final Clinical Impressions(s) / UC Diagnoses   Final diagnoses:  Acute cystitis with hematuria     Discharge Instructions     Antibiotic as prescribed. We will call you with your STD results if positive, and treat if indicated. Return for worsening symptoms, fever, back or belly pain.    ED Prescriptions    Medication Sig Dispense Auth. Provider   cephALEXin (KEFLEX) 500 MG capsule Take 1 capsule (500 mg total) by mouth 2 (two) times daily for 3 days. 6 capsule Hall-Potvin, [place], PA-C     Controlled Substance  Prescriptions [place] Controlled Substance Registry consulted? Not Applicable   Shea EvansHall-Potvin, Brittany, [place] JerseyPA-C 11/08/18 2040

## 2018-11-10 LAB — URINE CULTURE: Culture: NO GROWTH

## 2018-11-13 LAB — CERVICOVAGINAL ANCILLARY ONLY
Candida vaginitis: POSITIVE — AB
Chlamydia: NEGATIVE
Neisseria Gonorrhea: NEGATIVE
Trichomonas: NEGATIVE

## 2018-11-15 ENCOUNTER — Telehealth (HOSPITAL_COMMUNITY): Payer: Self-pay | Admitting: Emergency Medicine

## 2018-11-15 MED ORDER — METRONIDAZOLE 500 MG PO TABS: 500.0000 mg | ORAL_TABLET | Freq: Two times a day (BID) | ORAL | 0 refills | Status: AC

## 2018-11-15 MED ORDER — FLUCONAZOLE 150 MG PO TABS: 150.0000 mg | ORAL_TABLET | Freq: Once | ORAL | 0 refills | Status: AC

## 2018-11-15 NOTE — Telephone Encounter (Signed)
Bacterial vaginosis is positive. This was not treated at the urgent care visit.  Flagyl 500 mg BID x 7 days #14 no refills sent to patients pharmacy of choice.    Test for candida (yeast) was positive.  Prescription for fluconazole 150mg po now, repeat dose in 3d if needed, #2 no refills, sent to the pharmacy of record.  Recheck or followup with PCP for further evaluation if symptoms are not improving.    Patient contacted and made aware of    results, all questions answered   

## 2019-03-18 ENCOUNTER — Ambulatory Visit
Admission: EM | Admit: 2019-03-18 | Discharge: 2019-03-18 | Disposition: A | Discharge status: Home / Self Care | Payer: Medicaid Other | Attending: Emergency Medicine | Admitting: Emergency Medicine

## 2019-03-18 ENCOUNTER — Encounter: Payer: Self-pay | Admitting: Emergency Medicine

## 2019-03-18 ENCOUNTER — Other Ambulatory Visit: Payer: Self-pay

## 2019-03-18 DIAGNOSIS — Z3202 Encounter for pregnancy test, result negative: Secondary | ICD-10-CM

## 2019-03-18 DIAGNOSIS — N921 Excessive and frequent menstruation with irregular cycle: Secondary | ICD-10-CM

## 2019-03-18 LAB — POCT URINE PREGNANCY: Preg Test, Ur: NEGATIVE

## 2019-03-18 NOTE — Discharge Instructions (Signed)
Keep GYN appointment - make list of questions to ask. Keep symptom/period log moving forward & bring to GYN appointment. Return sooner for discharge, pain, fever.

## 2019-03-18 NOTE — ED Provider Notes (Signed)
[facility]    CSN: [id number] Arrival date & time: 03/18/19  1117      History   Chief Complaint No chief complaint on file.   HPI Jynesis Nakamura is a 24 y.o. female w/ history of BV presenting for brownish discharge x 2 weeks.  States that she works at [employer], where she is on her feet.  States that when she is at work she will have mild bleeding "like my period"and then when she is at home, reclined/sitting it will turn "brownish and drier".  Patient reporting currently taking OCPs, though does not occasionally miss doses.  Currently sexually active not routinely using condoms.  Denies vaginal discharge, irritation, pain, pelvic pain, abdominal pain, fever.  Has not tried thing for symptoms as "I do not really feel anything".  LMP 11/03/2018.  Denies personal history of endometriosis, fibroids.  Has GYN appointment 1/13: Intends to keep.   Past Medical History:  Diagnosis Date  . Iron deficiency anemia 02/2018    Patient Active Problem List   Diagnosis Date Noted  . Menorrhagia 02/22/2018  . Iron deficiency anemia due to chronic blood loss 02/22/2018  . Syncope 02/22/2018    Past Surgical History:  Procedure Laterality Date  . CHOLECYSTECTOMY      OB History   No obstetric history on file.      Home Medications    Prior to Admission medications   Medication Sig Start Date End Date Taking? Authorizing Provider  ferrous sulfate 325 (65 FE) MG EC tablet Take 1 tablet (325 mg total) by mouth 2 (two) times daily. 02/24/18 02/24/19  Pokhrel, Rebekah [place], MD  ibuprofen (ADVIL,MOTRIN) 800 MG tablet Take 800 mg by mouth every 8 (eight) hours as needed (back pain).    [provider]  Multiple Vitamins-Minerals (HAIR/SKIN/NAILS/BIOTIN) TABS Take 1 tablet by mouth daily.    [provider]    Family History Family History  Problem Relation Age of Onset  . Healthy Mother   . Healthy Father     Social History Social History   Tobacco Use  .  Smoking status: Never Smoker  . Smokeless tobacco: Never Used  Substance Use Topics  . Alcohol use: Never  . Drug use: Never     Allergies   Patient has no known allergies.   Review of Systems Review of Systems  Constitutional: Negative for fatigue and fever.  Respiratory: Negative for cough and shortness of breath.   Cardiovascular: Negative for chest pain and palpitations.  Gastrointestinal: Negative for constipation and diarrhea.  Genitourinary: Positive for menstrual problem. Negative for dysuria, flank pain, frequency, genital sores, hematuria, pelvic pain, urgency, vaginal bleeding, vaginal discharge and vaginal pain.  Hematological: Does not bruise/bleed easily.     Physical Exam Triage Vital Signs ED Triage Vitals  Enc Vitals Group     BP 03/18/19 1133 115/78     Pulse Rate 03/18/19 1133 76     Resp 03/18/19 1133 16     Temp 03/18/19 1133 98.2 F (36.8 C)     Temp Source 03/18/19 1133 Oral     SpO2 03/18/19 1133 97 %     Weight --      Height --      Head Circumference --      Peak Flow --      Pain Score 03/18/19 1140 0     Pain Loc --      Pain Edu? --      Excl. in [place]? --  No data found.  Updated Vital Signs BP 115/78 (BP Location: Left Arm)   Pulse 76   Temp 98.2 F (36.8 C) (Oral)   Resp 16   LMP 11/04/2018   SpO2 97%   Visual Acuity Right Eye Distance:   Left Eye Distance:   Bilateral Distance:    Right Eye Near:   Left Eye Near:    Bilateral Near:     Physical Exam Constitutional:      General: She is not in acute distress. HENT:     Head: Normocephalic and atraumatic.  Eyes:     General: No scleral icterus.    Pupils: Pupils are equal, round, and reactive to light.  Cardiovascular:     Rate and Rhythm: Normal rate.  Pulmonary:     Effort: Pulmonary effort is normal.  Abdominal:     General: Bowel sounds are normal.     Palpations: Abdomen is soft.     Tenderness: There is no abdominal tenderness. There is no right CVA  tenderness, left CVA tenderness or guarding.  Skin:    Coloration: Skin is not jaundiced or pale.  Neurological:     Mental Status: She is alert and oriented to person, place, and time.      UC Treatments / Results  Labs (all labs ordered are listed, but only abnormal results are displayed) Labs Reviewed  POCT URINE PREGNANCY - Normal    EKG   Radiology No results found.  Procedures Procedures (including critical care time)  Medications Ordered in UC Medications - No data to display  Initial Impression / Assessment and Plan / UC Course  I have reviewed the triage vital signs and the nursing notes.  Pertinent labs & imaging results that were available during my care of the patient were reviewed by me and considered in my medical decision making (see chart for details).     Urine pregnancy negative.  H&P consistent with breakthrough bleeding on OCPs.  Recommended secondary contraception method if missing doses.  Encourage symptoms/period log as outlined below.  Patient declined STD check today.  Return precautions discussed, patient verbalized understanding and is agreeable to plan.   Final Clinical Impressions(s) / UC Diagnoses   Final diagnoses:  Breakthrough bleeding on birth control pills     Discharge Instructions     Keep GYN appointment - make list of questions to ask. Keep symptom/period log moving forward & bring to GYN appointment. Return sooner for discharge, pain, fever.    ED Prescriptions    None     PDMP not reviewed this encounter.   Hall-Potvin, [place], [place] 03/18/19 1256

## 2019-03-18 NOTE — ED Triage Notes (Signed)
Pt presents to [facility] for assessment of vaginal discharge starting 2 weeks ago.  Patient states she takes the AZO cranberry tablets twice a day every day.  States she is not sure if that impacted.  First week states she had some red (blood-like) discharge when she was up and physical at work.  States it has turned brown x 1 week.  Pt c/o intermittent lower abdominal cramping, but denies at this time.  Denies burning with urination, denies frequent urination.

## 2019-07-18 ENCOUNTER — Ambulatory Visit
Admission: EM | Admit: 2019-07-18 | Discharge: 2019-07-18 | Disposition: A | Discharge status: Home / Self Care | Payer: Medicaid Other | Attending: Emergency Medicine | Admitting: Emergency Medicine

## 2019-07-18 DIAGNOSIS — N760 Acute vaginitis: Secondary | ICD-10-CM | POA: Insufficient documentation

## 2019-07-18 MED ORDER — FLUCONAZOLE 150 MG PO TABS: 150.0000 mg | ORAL_TABLET | Freq: Every day | ORAL | 0 refills | Status: DC

## 2019-07-18 NOTE — ED Provider Notes (Signed)
[facility]    CSN: [id number] Arrival date & time: 07/18/19  1802      History   Chief Complaint Chief Complaint  Patient presents with  . vaginal irritation    HPI Terry Blackburn is a 24 y.o. female presenting for vaginal irritation for the last 2 days.  Patient states that she had some burning with urination and vaginal discharge on Thursday, though this started to resolve.  States she started her cycle Friday which preceded irritation.  Patient denying discharge, pelvic pain, abdominal pain, back pain.  No urinary frequency, urgency.  No fever.  Has not taken anything for this.   Past Medical History:  Diagnosis Date  . Iron deficiency anemia 02/2018    Patient Active Problem List   Diagnosis Date Noted  . Menorrhagia 02/22/2018  . Iron deficiency anemia due to chronic blood loss 02/22/2018  . Syncope 02/22/2018    Past Surgical History:  Procedure Laterality Date  . CHOLECYSTECTOMY      OB History   No obstetric history on file.      Home Medications    Prior to Admission medications   Medication Sig Start Date End Date Taking? Authorizing Provider  ferrous sulfate 325 (65 FE) MG EC tablet Take 1 tablet (325 mg total) by mouth 2 (two) times daily. 02/24/18 02/24/19  Pokhrel, Corrie Mckusick, MD  fluconazole (DIFLUCAN) 150 MG tablet Take 1 tablet (150 mg total) by mouth daily. May repeat in 72 hours if needed 07/18/19   Hall-Potvin, [place], PA-C  ibuprofen (ADVIL,MOTRIN) 800 MG tablet Take 800 mg by mouth every 8 (eight) hours as needed (back pain).    [provider]  Multiple Vitamins-Minerals (HAIR/SKIN/NAILS/BIOTIN) TABS Take 1 tablet by mouth daily.    [provider]    Family History Family History  Problem Relation Age of Onset  . Healthy Mother   . Healthy Father     Social History Social History   Tobacco Use  . Smoking status: Never Smoker  . Smokeless tobacco: Never Used  Substance Use Topics  . Alcohol use:  Never  . Drug use: Never     Allergies   Patient has no known allergies.   Review of Systems As per HPI   Physical Exam Triage Vital Signs ED Triage Vitals  Enc Vitals Group     BP      Pulse      Resp      Temp      Temp src      SpO2      Weight      Height      Head Circumference      Peak Flow      Pain Score      Pain Loc      Pain Edu?      Excl. in [place]?    No data found.  Updated Vital Signs BP (!) 144/82 (BP Location: Left Arm)   Pulse 75   Temp 98.2 F (36.8 C) (Oral)   Resp 16   LMP 07/15/2019   SpO2 100%   Visual Acuity Right Eye Distance:   Left Eye Distance:   Bilateral Distance:    Right Eye Near:   Left Eye Near:    Bilateral Near:     Physical Exam Constitutional:      General: She is not in acute distress. HENT:     Head: Normocephalic and atraumatic.  Eyes:     General: No scleral  icterus.    Pupils: Pupils are equal, round, and reactive to light.  Cardiovascular:     Rate and Rhythm: Normal rate.  Pulmonary:     Effort: Pulmonary effort is normal.  Abdominal:     General: Bowel sounds are normal.     Palpations: Abdomen is soft.     Tenderness: There is no abdominal tenderness. There is no right CVA tenderness, left CVA tenderness or guarding.  Genitourinary:    Comments: Patient declined, self-swab performed Skin:    Coloration: Skin is not jaundiced or pale.  Neurological:     Mental Status: She is alert and oriented to person, place, and time.      UC Treatments / Results  Labs (all labs ordered are listed, but only abnormal results are displayed) Labs Reviewed  CERVICOVAGINAL ANCILLARY ONLY    EKG   Radiology No results found.  Procedures Procedures (including critical care time)  Medications Ordered in UC Medications - No data to display  Initial Impression / Assessment and Plan / UC Course  I have reviewed the triage vital signs and the nursing notes.  Pertinent labs & imaging results that were  available during my care of the patient were reviewed by me and considered in my medical decision making (see chart for details).     Patient afebrile, nontoxic in office today.  Cytology pending: We will cover for yeast vaginitis as outlined below, treat additionally if indicated.  Return precautions discussed, patient verbalized understanding and is agreeable to plan. Final Clinical Impressions(s) / UC Diagnoses   Final diagnoses:  Acute vaginitis     Discharge Instructions     Testing for chlamydia, gonorrhea, trichomonas is pending: please look for these results on the MyChart app/website.  We will notify you if you are positive and outline treatment at that time.  Important to avoid all forms of sexual intercourse (oral, vaginal, anal) with any/all partners for the next 7 days to avoid spreading/reinfecting. Any/all sexual partners should be notified of testing/treatment today.  Return for persistent/worsening symptoms or if you develop fever, abdominal or pelvic pain, blood in your urine, or are re-exposed to an STI.    ED Prescriptions    Medication Sig Dispense Auth. Provider   fluconazole (DIFLUCAN) 150 MG tablet Take 1 tablet (150 mg total) by mouth daily. May repeat in 72 hours if needed 2 tablet Hall-Potvin, [place], PA-C     PDMP not reviewed this encounter.   Odette Fraction [place], [place] 07/19/19 1954

## 2019-07-18 NOTE — ED Triage Notes (Signed)
Pt states had urinary burning and vaginal discharge last Thursday that subsided when she started her menstrual cycle on Friday, now having vaginal irritation.

## 2019-07-18 NOTE — Discharge Instructions (Signed)

## 2019-07-20 ENCOUNTER — Telehealth: Payer: Self-pay

## 2019-07-20 DIAGNOSIS — N760 Acute vaginitis: Secondary | ICD-10-CM

## 2019-07-20 LAB — CERVICOVAGINAL ANCILLARY ONLY
Bacterial Vaginitis (gardnerella): POSITIVE — AB
Chlamydia: NEGATIVE
Comment: NEGATIVE
Comment: NEGATIVE
Comment: NEGATIVE
Comment: NORMAL
Neisseria Gonorrhea: NEGATIVE
Trichomonas: NEGATIVE

## 2019-07-20 MED ORDER — METRONIDAZOLE 500 MG PO TABS: 500.0000 mg | ORAL_TABLET | Freq: Two times a day (BID) | ORAL | 0 refills | Status: DC

## 2019-07-20 NOTE — Telephone Encounter (Signed)
Pt positive for BV.  Rx for Flagyl sent to pharmacy of record.  Attempted to contact pt.  No answer, no v/m set up.

## 2019-07-21 ENCOUNTER — Telehealth (HOSPITAL_COMMUNITY): Payer: Self-pay | Admitting: Physician Assistant

## 2019-07-21 DIAGNOSIS — N760 Acute vaginitis: Secondary | ICD-10-CM

## 2019-07-21 MED ORDER — METRONIDAZOLE 500 MG PO TABS: 500.0000 mg | ORAL_TABLET | Freq: Two times a day (BID) | ORAL | 0 refills | Status: DC

## 2019-07-21 NOTE — Telephone Encounter (Signed)
Made aware of BV script, requests that it be sent to [facility]
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
## 2019-11-11 ENCOUNTER — Ambulatory Visit
Admission: EM | Admit: 2019-11-11 | Discharge: 2019-11-11 | Disposition: A | Discharge status: Home / Self Care | Payer: Self-pay | Attending: Emergency Medicine | Admitting: Emergency Medicine

## 2019-11-11 DIAGNOSIS — M545 Low back pain, unspecified: Secondary | ICD-10-CM

## 2019-11-11 MED ORDER — CYCLOBENZAPRINE HCL 5 MG PO TABS: 5.0000 mg | ORAL_TABLET | Freq: Two times a day (BID) | ORAL | 0 refills | Status: AC | PRN

## 2019-11-11 MED ORDER — IBUPROFEN 800 MG PO TABS: 800.0000 mg | ORAL_TABLET | Freq: Three times a day (TID) | ORAL | 0 refills | Status: DC | PRN
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
## 2019-11-11 NOTE — Discharge Instructions (Addendum)
RICE: rest, ice, compression, elevation as needed for pain.   Cold therapy (ice packs) can be used to help swelling both after injury and after prolonged use of areas of chronic pain/aches.  Pain medication:  350 mg-1000 mg of Tylenol (acetaminophen) and/or 200 mg - 800 mg of Advil (ibuprofen, Motrin) every 8 hours as needed.  May alternate between the two throughout the day as they are generally safe to take together.  DO NOT exceed more than 3000 mg of Tylenol or 3200 mg of ibuprofen in a 24 hour period as this could damage your stomach, kidneys, liver, or increase your bleeding risk.  May take muscle relaxer as needed for severe pain / spasm.  (This medication may cause you to become tired so it is important you do not drink alcohol or operate heavy machinery while on this medication.  Recommend your first dose to be taken before bedtime to monitor for side effects safely)  Important to follow up with specialist(s) below for further evaluation/management if your symptoms persist or worsen. 
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
## 2020-01-30 ENCOUNTER — Emergency Department (INDEPENDENT_AMBULATORY_CARE_PROVIDER_SITE_OTHER)
Admission: EM | Admit: 2020-01-30 | Discharge: 2020-01-30 | Disposition: A | Discharge status: Home / Self Care | Payer: Self-pay | Source: Home / Self Care

## 2020-01-30 ENCOUNTER — Other Ambulatory Visit: Payer: Self-pay

## 2020-01-30 DIAGNOSIS — S61411A Laceration without foreign body of right hand, initial encounter: Secondary | ICD-10-CM

## 2020-01-30 DIAGNOSIS — Z23 Encounter for immunization: Secondary | ICD-10-CM

## 2020-01-30 MED ORDER — TETANUS-DIPHTH-ACELL PERTUSSIS 5-2.5-18.5 LF-MCG/0.5 IM SUSY
0.5000 mL | PREFILLED_SYRINGE | Freq: Once | INTRAMUSCULAR | Status: AC
  Administered 2020-01-30: 0.5 mL via INTRAMUSCULAR

## 2020-01-30 NOTE — Discharge Instructions (Signed)
  You may gently clean with wound with warm water and soap. Pat dry, do not rub or pick at the wound. You can change the bandage at least twice daily, more often if it becomes wet or dirty.   Follow up in 4-5 days if not improving, follow up sooner if symptoms worsening- redness, swelling, drainage of pus, or fever.

## 2020-01-30 NOTE — ED Provider Notes (Signed)
[facility]    CSN: [id number] Arrival date & time: 01/30/20  1901      History   Chief Complaint Chief Complaint  Patient presents with  . Laceration    RT hand    HPI Terry Blackburn is a 24 y.o. female.   HPI  Terry Blackburn is a 24 y.o. female presenting to UC with c/o laceration to her Right hand while at work around 11-12PM.  She cleaned the wound with peroxide and applied wound glue.  Bleeding controlled PTA.  Pt unsure of her last tetanus.    Past Medical History:  Diagnosis Date  . Iron deficiency anemia 02/2018    Patient Active Problem List   Diagnosis Date Noted  . Menorrhagia 02/22/2018  . Iron deficiency anemia due to chronic blood loss 02/22/2018  . Syncope 02/22/2018    Past Surgical History:  Procedure Laterality Date  . CHOLECYSTECTOMY      OB History   No obstetric history on file.      Home Medications    Prior to Admission medications   Medication Sig Start Date End Date Taking? Authorizing Provider  ferrous sulfate 325 (65 FE) MG EC tablet Take 1 tablet (325 mg total) by mouth 2 (two) times daily. 02/24/18 02/24/19  Pokhrel, Rebekah [place], MD  fluconazole (DIFLUCAN) 150 MG tablet Take 1 tablet (150 mg total) by mouth daily. May repeat in 72 hours if needed 07/18/19   Hall-Potvin, [place], PA-C  ibuprofen (ADVIL) 800 MG tablet Take 1 tablet (800 mg total) by mouth every 8 (eight) hours as needed (back pain). 11/11/19   Hall-Potvin, [place], PA-C  metroNIDAZOLE (FLAGYL) 500 MG tablet Take 1 tablet (500 mg total) by mouth 2 (two) times daily. 07/21/19   Cathie Hoops, Amy V, PA-C  Multiple Vitamins-Minerals (HAIR/SKIN/NAILS/BIOTIN) TABS Take 1 tablet by mouth daily.    [provider]    Family History Family History  Problem Relation Age of Onset  . Healthy Mother   . Healthy Father     Social History Social History   Tobacco Use  . Smoking status: Never Smoker  . Smokeless tobacco: Never Used  Vaping Use  . Vaping Use:  Never used  Substance Use Topics  . Alcohol use: Never  . Drug use: Never     Allergies   Patient has no known allergies.   Review of Systems Review of Systems  Musculoskeletal: Negative for arthralgias and joint swelling.  Skin: Positive for wound. Negative for color change.     Physical Exam Triage Vital Signs ED Triage Vitals  Enc Vitals Group     BP 01/30/20 1910 125/75     Pulse Rate 01/30/20 1910 73     Resp 01/30/20 1910 18     Temp 01/30/20 1910 98.1 F (36.7 C)     Temp Source 01/30/20 1910 Oral     SpO2 01/30/20 1910 100 %     Weight --      Height --      Head Circumference --      Peak Flow --      Pain Score 01/30/20 1912 5     Pain Loc --      Pain Edu? --      Excl. in [place]? --    No data found.  Updated Vital Signs BP 125/75 (BP Location: Left Arm)   Pulse 73   Temp 98.1 F (36.7 C) (Oral)   Resp 18   SpO2 100%   Visual  Acuity Right Eye Distance:   Left Eye Distance:   Bilateral Distance:    Right Eye Near:   Left Eye Near:    Bilateral Near:     Physical Exam Vitals and nursing note reviewed.  Constitutional:      Appearance: Normal appearance. She is well-developed.  HENT:     Head: Normocephalic and atraumatic.  Cardiovascular:     Rate and Rhythm: Normal rate and regular rhythm.     Pulses:          Radial pulses are 2+ on the right side.  Pulmonary:     Effort: Pulmonary effort is normal.  Musculoskeletal:        General: Normal range of motion.       Hands:     Cervical back: Normal range of motion.  Skin:    General: Skin is warm and dry.     Capillary Refill: Capillary refill takes less than 2 seconds.  Neurological:     Mental Status: She is alert and oriented to person, place, and time.     Sensory: No sensory deficit.  Psychiatric:        Behavior: Behavior normal.      UC Treatments / Results  Labs (all labs ordered are listed, but only abnormal results are displayed) Labs Reviewed - No data to display   EKG   Radiology No results found.  Procedures Procedures (including critical care time)  Medications Ordered in UC Medications  Tdap (BOOSTRIX) injection 0.5 mL (0.5 mLs Intramuscular Given 01/30/20 1917)    Initial Impression / Assessment and Plan / UC Course  I have reviewed the triage vital signs and the nursing notes.  Pertinent labs & imaging results that were available during my care of the patient were reviewed by me and considered in my medical decision making (see chart for details).     Wound appears clean, superficial no foreign bodies Wound glue already in place No additional skin closure indicated at this time F/u with PCP or return to UC for wound recheck if needed AVS given  Final Clinical Impressions(s) / UC Diagnoses   Final diagnoses:  Laceration of right hand without foreign body, initial encounter     Discharge Instructions      You may gently clean with wound with warm water and soap. Pat dry, do not rub or pick at the wound. You can change the bandage at least twice daily, more often if it becomes wet or dirty.   Follow up in 4-5 days if not improving, follow up sooner if symptoms worsening- redness, swelling, drainage of pus, or fever.     ED Prescriptions    None     PDMP not reviewed this encounter.   Lurene Shadow, [place] 01/30/20 1944

## 2020-01-30 NOTE — ED Triage Notes (Addendum)
Pt c/o laceration to RT hand when she sliced on some glass. Last tdap unknown.

## 2020-09-19 ENCOUNTER — Encounter: Payer: Self-pay | Admitting: Emergency Medicine

## 2020-09-19 ENCOUNTER — Other Ambulatory Visit: Payer: Self-pay

## 2020-09-19 ENCOUNTER — Ambulatory Visit
Admission: EM | Admit: 2020-09-19 | Discharge: 2020-09-19 | Disposition: A | Discharge status: Home / Self Care | Payer: Self-pay

## 2020-09-19 DIAGNOSIS — N898 Other specified noninflammatory disorders of vagina: Secondary | ICD-10-CM

## 2020-09-19 DIAGNOSIS — L923 Foreign body granuloma of the skin and subcutaneous tissue: Secondary | ICD-10-CM

## 2020-09-19 DIAGNOSIS — J069 Acute upper respiratory infection, unspecified: Secondary | ICD-10-CM

## 2020-09-19 LAB — POCT RAPID STREP A (OFFICE): Rapid Strep A Screen: NEGATIVE

## 2020-09-19 MED ORDER — FLUCONAZOLE 150 MG PO TABS: 150.0000 mg | ORAL_TABLET | Freq: Once | ORAL | 0 refills | Status: AC

## 2020-09-19 NOTE — ED Triage Notes (Signed)
Patient c/o fever and sore throat x 2 weeks.   Patient endorses a temperature of 99.8 F at home at it's highest.   Patient took a COVID test at home with negative test results.   Patient denies pain with swallowing.   Patient endorses chest soreness.   Patient has taken Tylenol and Benadryl with no relief of symptoms.

## 2020-09-19 NOTE — ED Provider Notes (Signed)
[facility]    CSN: [id number] Arrival date & time: 09/19/20  0850      History   Chief Complaint Chief Complaint  Patient presents with   Fever   Sore Throat    HPI Terry Blackburn is a 25 y.o. female.   Patient presenting today with 2-day history of right-sided sinus pain and pressure, congestion, cough, sore throat, low-grade fever, chills, sweats.  Denies chest pain, shortness of breath, abdominal pain, nausea vomiting or diarrhea.  So far taking Benadryl and Flonase with minimal relief of symptoms.  Has taken 2 home COVID test so far both have been negative.  She also got a tattoo last week and has been having skin pigment changes and a little bit of swelling on one portion of it.  Has been using A&E and Vaseline on the area and feels like it is getting a little bit better but concern for infection.  She just got off of antibiotics a few days ago for a separate issue, states she thinks that she got a yeast infection from these and is having some external vaginal itching and irritation.  Denies new sexual partners, abdominal pain, pelvic pain, vaginal bleeding or significant vaginal discharge.  Not trying anything over-the-counter for the symptoms.   Past Medical History:  Diagnosis Date   Iron deficiency anemia 02/2018    Patient Active Problem List   Diagnosis Date Noted   Menorrhagia 02/22/2018   Iron deficiency anemia due to chronic blood loss 02/22/2018   Syncope 02/22/2018    Past Surgical History:  Procedure Laterality Date   CHOLECYSTECTOMY      OB History   No obstetric history on file.      Home Medications    Prior to Admission medications   Medication Sig Start Date End Date Taking? Authorizing Provider  ALAYCEN 1/35 tablet Take 1 tablet by mouth daily. 06/19/20  Yes [provider]  fluconazole (DIFLUCAN) 150 MG tablet Take 1 tablet (150 mg total) by mouth once for 1 dose. 09/19/20 09/19/20 Yes Particia Nearing, PA-C  ferrous  sulfate 325 (65 FE) MG EC tablet Take 1 tablet (325 mg total) by mouth 2 (two) times daily. 02/24/18 02/24/19  Pokhrel, Rebekah [place], MD  fluconazole (DIFLUCAN) 150 MG tablet Take 1 tablet (150 mg total) by mouth daily. May repeat in 72 hours if needed 07/18/19   Hall-Potvin, [place], PA-C  ibuprofen (ADVIL) 800 MG tablet Take 1 tablet (800 mg total) by mouth every 8 (eight) hours as needed (back pain). 11/11/19   Hall-Potvin, [place], PA-C  metroNIDAZOLE (FLAGYL) 500 MG tablet Take 1 tablet (500 mg total) by mouth 2 (two) times daily. 07/21/19   Cathie Hoops, Amy V, PA-C  Multiple Vitamins-Minerals (HAIR/SKIN/NAILS/BIOTIN) TABS Take 1 tablet by mouth daily.    [provider]    Family History Family History  Problem Relation Age of Onset   Healthy Mother    Healthy Father     Social History Social History   Tobacco Use   Smoking status: Never   Smokeless tobacco: Never  Vaping Use   Vaping Use: Never used  Substance Use Topics   Alcohol use: Never   Drug use: Never     Allergies   Patient has no known allergies.   Review of Systems Review of Systems Per HPI  Physical Exam Triage Vital Signs ED Triage Vitals  Enc Vitals Group     BP 09/19/20 0923 119/85     Pulse Rate 09/19/20 0923 77  Resp 09/19/20 0923 13     Temp 09/19/20 0923 98.8 F (37.1 C)     Temp Source 09/19/20 0923 Oral     SpO2 09/19/20 0923 99 %     Weight --      Height --      Head Circumference --      Peak Flow --      Pain Score 09/19/20 0919 5     Pain Loc --      Pain Edu? --      Excl. in GC? --    No data found.  Updated Vital Signs BP 119/85 (BP Location: Left Arm)   Pulse 77   Temp 98.8 F (37.1 C) (Oral)   Resp 13   LMP  (LMP Unknown)   SpO2 99%   Visual Acuity Right Eye Distance:   Left Eye Distance:   Bilateral Distance:    Right Eye Near:   Left Eye Near:    Bilateral Near:     Physical Exam Vitals and nursing note reviewed.  Constitutional:      Appearance: Normal  appearance. She is not ill-appearing.  HENT:     Head: Atraumatic.     Right Ear: Tympanic membrane normal.     Left Ear: Tympanic membrane normal.     Nose: Nose normal.     Mouth/Throat:     Mouth: Mucous membranes are moist.     Pharynx: Posterior oropharyngeal erythema present. No oropharyngeal exudate.  Eyes:     Extraocular Movements: Extraocular movements intact.     Conjunctiva/sclera: Conjunctivae normal.  Cardiovascular:     Rate and Rhythm: Normal rate and regular rhythm.     Heart sounds: Normal heart sounds.  Pulmonary:     Effort: Pulmonary effort is normal. No respiratory distress.     Breath sounds: Normal breath sounds. No wheezing.  Abdominal:     General: Bowel sounds are normal. There is no distension.     Palpations: Abdomen is soft.     Tenderness: There is no abdominal tenderness. There is no guarding.  Musculoskeletal:        General: Normal range of motion.     Cervical back: Normal range of motion and neck supple.  Skin:    General: Skin is warm.     Comments: Small area of her new tattoo left forearm that is hypopigmented and mildly edematous.  No erythema, drainage, scabbing.  Neurological:     Mental Status: She is alert and oriented to person, place, and time.     Motor: No weakness.     Gait: Gait normal.  Psychiatric:        Mood and Affect: Mood normal.        Thought Content: Thought content normal.        Judgment: Judgment normal.   UC Treatments / Results  Labs (all labs ordered are listed, but only abnormal results are displayed) Labs Reviewed  CULTURE, GROUP A STREP ([facility])  COVID-19, FLU A+B NAA  POCT RAPID STREP A (OFFICE)  CERVICOVAGINAL ANCILLARY ONLY    EKG   Radiology No results found.  Procedures Procedures (including critical care time)  Medications Ordered in UC Medications - No data to display  Initial Impression / Assessment and Plan / UC Course  I have reviewed the triage vital signs and the nursing  notes.  Pertinent labs & imaging results that were available during my care of the patient were reviewed by me and considered  in my medical decision making (see chart for details).     Overall vitals and exam very reassuring today.  Suspect viral URI causing symptoms.  COVID and flu test pending, rapid strep negative, throat culture pending.  Discussed over-the-counter supportive medications and home care.  Return for acutely worsening symptoms.  We will send a Diflucan tab and await vaginal swab results, suspect antibiotic related yeast infection causing her symptoms.  Tattoo does not appear to be infected today but there is a portion that is irritated and inflamed.  Discussed Neosporin, keep covered and close follow-up with PCP for recheck within the next week particularly if worsening.  Patient verbalized understanding to plan. Final Clinical Impressions(s) / UC Diagnoses   Final diagnoses:  Viral URI  Tattoo reaction  Vaginal irritation   Discharge Instructions   None    ED Prescriptions     Medication Sig Dispense Auth. Provider   fluconazole (DIFLUCAN) 150 MG tablet Take 1 tablet (150 mg total) by mouth once for 1 dose. 1 tablet Particia Nearing, [place]      PDMP not reviewed this encounter.   Roosvelt Maser [place], [place] 09/19/20 [phone]

## 2020-09-20 ENCOUNTER — Telehealth (HOSPITAL_COMMUNITY): Payer: Self-pay | Admitting: Emergency Medicine

## 2020-09-20 LAB — COVID-19, FLU A+B NAA
Influenza A, NAA: NOT DETECTED
Influenza B, NAA: NOT DETECTED
SARS-CoV-2, NAA: DETECTED — AB

## 2020-09-20 LAB — CERVICOVAGINAL ANCILLARY ONLY
Bacterial Vaginitis (gardnerella): POSITIVE — AB
Candida Glabrata: NEGATIVE
Candida Vaginitis: POSITIVE — AB
Chlamydia: NEGATIVE
Comment: NEGATIVE
Comment: NEGATIVE
Comment: NEGATIVE
Comment: NEGATIVE
Comment: NEGATIVE
Comment: NORMAL
Neisseria Gonorrhea: NEGATIVE
Trichomonas: NEGATIVE

## 2020-09-20 MED ORDER — METRONIDAZOLE 500 MG PO TABS: 500.0000 mg | ORAL_TABLET | Freq: Two times a day (BID) | ORAL | 0 refills | Status: DC

## 2020-09-22 LAB — CULTURE, GROUP A STREP (THRC)

## 2020-11-12 ENCOUNTER — Ambulatory Visit
Admission: EM | Admit: 2020-11-12 | Discharge: 2020-11-12 | Disposition: A | Discharge status: Home / Self Care | Payer: Self-pay | Attending: Nurse Practitioner | Admitting: Nurse Practitioner

## 2020-11-12 ENCOUNTER — Other Ambulatory Visit: Payer: Self-pay

## 2020-11-12 ENCOUNTER — Encounter: Payer: Self-pay | Admitting: Emergency Medicine

## 2020-11-12 DIAGNOSIS — N898 Other specified noninflammatory disorders of vagina: Secondary | ICD-10-CM | POA: Insufficient documentation

## 2020-11-12 DIAGNOSIS — N760 Acute vaginitis: Secondary | ICD-10-CM | POA: Insufficient documentation

## 2020-11-12 LAB — POCT URINALYSIS DIP (MANUAL ENTRY)
Bilirubin, UA: NEGATIVE
Glucose, UA: NEGATIVE mg/dL
Ketones, POC UA: NEGATIVE mg/dL
Leukocytes, UA: NEGATIVE
Nitrite, UA: NEGATIVE
Protein Ur, POC: NEGATIVE mg/dL
Spec Grav, UA: 1.01 (ref 1.010–1.025)
Urobilinogen, UA: 0.2 E.U./dL
pH, UA: 6.5 (ref 5.0–8.0)

## 2020-11-12 LAB — POCT URINE PREGNANCY: Preg Test, Ur: NEGATIVE

## 2020-11-12 MED ORDER — FLUCONAZOLE 150 MG PO TABS: 150.0000 mg | ORAL_TABLET | ORAL | 0 refills | Status: AC

## 2020-11-12 MED ORDER — NYSTATIN-TRIAMCINOLONE 100000-0.1 UNIT/GM-% EX CREA: TOPICAL_CREAM | CUTANEOUS | 0 refills | Status: DC

## 2020-11-12 NOTE — Discharge Instructions (Addendum)
Take medications as prescribed Further treatment may be needed pending the results of the testing that has been sent out. You will receive a phone call if anything is abnormal You can check MyChart in a couple of days to see all your results  Do not use products that can irritate the vagina  Use fragrance free products  Wear cotton panties  Avoid tight fitting pants Practice safe sex and use condoms

## 2020-11-12 NOTE — ED Provider Notes (Signed)
[facility]    CSN: [id number] Arrival date & time: 11/12/20  4496      History   Chief Complaint Chief Complaint  Patient presents with  . Vaginal Itching    HPI Terry Blackburn is a 25 y.o. female.   Subjective:   Terry Blackburn is a 25 y.o. female who presents for evaluation of vaginal symptoms of burning, itching, and irritation. Symptoms have been present for more than 2 weeks and seem to have started after completing treatment for BV and yeast on 09/19/20. She has since changed body washes which seemed to have made her symptoms worse. She also reports a rash to her perineal area. She denies any dysuria, odor, discharge or lesions. She denies any current sexual activity or concerns for STI. She has a history of chlamydia. Cycles are irregular and she is on birth control.   The following portions of the patient's history were reviewed and updated as appropriate: allergies, current medications, past family history, past medical history, past social history, past surgical history, and problem list.     She was treated for bacterial vagintiis and yeast infection on 09/19/20  Past Medical History:  Diagnosis Date  . Iron deficiency anemia 02/2018    Patient Active Problem List   Diagnosis Date Noted  . Menorrhagia 02/22/2018  . Iron deficiency anemia due to chronic blood loss 02/22/2018  . Syncope 02/22/2018    Past Surgical History:  Procedure Laterality Date  . CHOLECYSTECTOMY      OB History   No obstetric history on file.      Home Medications    Prior to Admission medications   Medication Sig Start Date End Date Taking? Authorizing Provider  fluconazole (DIFLUCAN) 150 MG tablet Take 1 tablet (150 mg total) by mouth every 3 (three) days for 2 doses. 11/12/20 11/16/20 Yes Lurline Idol, FNP  nystatin-triamcinolone (MYCOLOG II) cream Apply to affected area twice a day until symptoms resolve. Do not use for more than 2 weeks 11/12/20  Yes Lurline Idol, FNP  [facility] 1/35 tablet Take 1 tablet by mouth daily. 06/19/20   [provider]  ferrous sulfate 325 (65 FE) MG EC tablet Take 1 tablet (325 mg total) by mouth 2 (two) times daily. 02/24/18 02/24/19  Pokhrel, Rebekah [place], MD  ibuprofen (ADVIL) 800 MG tablet Take 1 tablet (800 mg total) by mouth every 8 (eight) hours as needed (back pain). 11/11/19   Hall-Potvin, [place], PA-C  Multiple Vitamins-Minerals (HAIR/SKIN/NAILS/BIOTIN) TABS Take 1 tablet by mouth daily.    [provider]    Family History Family History  Problem Relation Age of Onset  . Healthy Mother   . Healthy Father     Social History Social History   Tobacco Use  . Smoking status: Never  . Smokeless tobacco: Never  Vaping Use  . Vaping Use: Never used  Substance Use Topics  . Alcohol use: Never  . Drug use: Never     Allergies   Patient has no known allergies.   Review of Systems Review of Systems  Constitutional: Negative.  Negative for fever.  Gastrointestinal:  Negative for abdominal pain, nausea and vomiting.  Genitourinary:  Negative for dysuria, flank pain, frequency, vaginal bleeding, vaginal discharge and vaginal pain.  Musculoskeletal:  Negative for back pain.    Physical Exam Triage Vital Signs ED Triage Vitals  Enc Vitals Group     BP 11/12/20 0826 105/73     Pulse Rate 11/12/20 0826 98  Resp 11/12/20 0826 20     Temp 11/12/20 0826 98.2 F (36.8 C)     Temp Source 11/12/20 0826 Oral     SpO2 11/12/20 0826 98 %     Weight --      Height --      Head Circumference --      Peak Flow --      Pain Score 11/12/20 0835 2     Pain Loc --      Pain Edu? --      Excl. in GC? --    No data found.  Updated Vital Signs BP 105/73 (BP Location: Left Arm)   Pulse 98   Temp 98.2 F (36.8 C) (Oral)   Resp 20   SpO2 98%   Visual Acuity Right Eye Distance:   Left Eye Distance:   Bilateral Distance:    Right Eye Near:   Left Eye Near:    Bilateral Near:      Physical Exam Vitals reviewed.  Constitutional:      Appearance: Normal appearance.  HENT:     Head: Normocephalic.  Cardiovascular:     Rate and Rhythm: Normal rate.  Pulmonary:     Effort: Pulmonary effort is normal.  Genitourinary:    Comments: Exam deferred. Patient performed self swab.  Musculoskeletal:        General: Normal range of motion.     Cervical back: Normal range of motion and neck supple.  Skin:    General: Skin is warm and dry.  Neurological:     General: No focal deficit present.     Mental Status: She is alert and oriented to person, place, and time.  Psychiatric:        Mood and Affect: Mood normal.        Behavior: Behavior normal.        Thought Content: Thought content normal.        Judgment: Judgment normal.     UC Treatments / Results  Labs (all labs ordered are listed, but only abnormal results are displayed) Labs Reviewed  POCT URINALYSIS DIP (MANUAL ENTRY) - Abnormal; Notable for the following components:      Result Value   Blood, UA trace-intact (*)    All other components within normal limits  URINE CULTURE  POCT URINE PREGNANCY  CERVICOVAGINAL ANCILLARY ONLY    EKG   Radiology No results found.  Procedures Procedures (including critical care time)  Medications Ordered in UC Medications - No data to display  Initial Impression / Assessment and Plan / UC Course  I have reviewed the triage vital signs and the nursing notes.  Pertinent labs & imaging results that were available during my care of the patient were reviewed by me and considered in my medical decision making (see chart for details).     25 yo female presenting with vaginal burning, itching and irritation.  No dysuria, odor, discharge or lesions. She was treated for BV/yeast infection with flagyl and diflucan back on 09/19/20. Current symptoms have been ongoing since completing these therapies. Urine pregnancy negative. UA negative for acute cystitis. Cytology  swab and urine cultures pending. Will treat with diflucan and mycolog cream for now. Further treatment pending results of cytology swab.   Plan:  Symptomatic local care discussed. Oral antifungal see orders. Abstinence from intercourse discussed. Discussed safe sex.  Today's evaluation has revealed no signs of a dangerous process. Discussed diagnosis with patient and/or guardian. Patient and/or guardian aware of  their diagnosis, possible red flag symptoms to watch out for and need for close follow up. Patient and/or guardian understands verbal and written discharge instructions. Patient and/or guardian comfortable with plan and disposition.  Patient and/or guardian has a clear mental status at this time, good insight into illness (after discussion and teaching) and has clear judgment to make decisions regarding their care  This care was provided during an unprecedented National Emergency due to the Novel Coronavirus (COVID-19) pandemic. COVID-19 infections and transmission risks place heavy strains on healthcare resources.  As this pandemic evolves, our facility, providers, and staff strive to respond fluidly, to remain operational, and to provide care relative to available resources and information. Outcomes are unpredictable and treatments are without well-defined guidelines. Further, the impact of COVID-19 on all aspects of urgent care, including the impact to patients seeking care for reasons other than COVID-19, is unavoidable during this national emergency. At this time of the global pandemic, management of patients has significantly changed, even for non-COVID positive patients given high local and regional COVID volumes at this time requiring high healthcare system and resource utilization. The standard of care for management of both COVID suspected and non-COVID suspected patients continues to change rapidly at the local, regional, national, and global levels. This patient was worked up and  treated to the best available but ever changing evidence and resources available at this current time.   Documentation was completed with the aid of voice recognition software. Transcription may contain typographical errors.  Final diagnoses:  Vaginitis and vulvovaginitis  Vaginal itching     Discharge Instructions      Take medications as prescribed Further treatment may be needed pending the results of the testing that has been sent out. You will receive a phone call if anything is abnormal You can check MyChart in a couple of days to see all your results  Do not use products that can irritate the vagina  Use fragrance free products  Wear cotton panties  Avoid tight fitting pants Practice safe sex and use condoms     ED Prescriptions     Medication Sig Dispense Auth. Provider   nystatin-triamcinolone (MYCOLOG II) cream Apply to affected area twice a day until symptoms resolve. Do not use for more than 2 weeks 15 g Lurline Idol, FNP   fluconazole (DIFLUCAN) 150 MG tablet Take 1 tablet (150 mg total) by mouth every 3 (three) days for 2 doses. 2 tablet Lurline Idol, FNP      PDMP not reviewed this encounter.   Cathlean Sauer [place], [place] 11/12/20 [phone]

## 2020-11-12 NOTE — ED Triage Notes (Signed)
C/o new vaginal itching/burning/irritation unrelated to urination developing over the last two weeks. Denies white, yellow, grey vaginal discharge, fever, recent or new sexual activity. Reports concerns for new rash to perineal area. Recent antibiotic use and gets the area waxed once a month.

## 2020-11-13 LAB — URINE CULTURE
Culture: <10000 — AB
Special Requests: NORMAL

## 2020-11-13 LAB — CERVICOVAGINAL ANCILLARY ONLY
Bacterial Vaginitis (gardnerella): NEGATIVE
Candida Glabrata: NEGATIVE
Candida Vaginitis: POSITIVE — AB
Chlamydia: NEGATIVE
Comment: NEGATIVE
Comment: NEGATIVE
Comment: NEGATIVE
Comment: NEGATIVE
Comment: NEGATIVE
Comment: NORMAL
Neisseria Gonorrhea: NEGATIVE
Trichomonas: NEGATIVE

## 2021-06-30 ENCOUNTER — Ambulatory Visit
Admission: EM | Admit: 2021-06-30 | Discharge: 2021-06-30 | Disposition: A | Discharge status: Home / Self Care | Payer: Self-pay | Attending: Physician Assistant | Admitting: Physician Assistant

## 2021-06-30 ENCOUNTER — Other Ambulatory Visit: Payer: Self-pay

## 2021-06-30 DIAGNOSIS — J020 Streptococcal pharyngitis: Secondary | ICD-10-CM

## 2021-06-30 LAB — POCT RAPID STREP A (OFFICE): Rapid Strep A Screen: NEGATIVE

## 2021-06-30 MED ORDER — PENICILLIN V POTASSIUM 500 MG PO TABS: 500.0000 mg | ORAL_TABLET | Freq: Two times a day (BID) | ORAL | 0 refills | Status: AC

## 2021-06-30 NOTE — Discharge Instructions (Signed)
Take medication as prescribed ?Discard toothbrush on day 3 ?

## 2021-06-30 NOTE — ED Provider Notes (Signed)
?[facility] ? ? ? ?CSN: [id number] ?Arrival date & time: 06/30/21  0816 ? ? ?  ? ?History   ?Chief Complaint ?Chief Complaint  ?Patient presents with  ? Fever  ? Sore Throat  ? Headache  ? Nasal Congestion  ? ? ?HPI ?Terry Blackburn is a 26 y.o. female.  ? ?Patient here concerned with sore throat x 3 days.  Exposed to strep at work.  She reports painful anterior cervical LAD.  No advil or tylenol today.   ? ? ?Past Medical History:  ?Diagnosis Date  ? Iron deficiency anemia 02/2018  ? ? ?Patient Active Problem List  ? Diagnosis Date Noted  ? Menorrhagia 02/22/2018  ? Iron deficiency anemia due to chronic blood loss 02/22/2018  ? Syncope 02/22/2018  ? ? ?Past Surgical History:  ?Procedure Laterality Date  ? CHOLECYSTECTOMY    ? ? ?OB History   ?No obstetric history on file. ?  ? ? ? ?Home Medications   ? ?Prior to Admission medications   ?Medication Sig Start Date End Date Taking? Authorizing Provider  ?penicillin v potassium (VEETID) 500 MG tablet Take 1 tablet (500 mg total) by mouth in the morning and at bedtime for 7 days. 06/30/21 07/07/21 Yes Evern CoreLindquist, Helen Cuff, PA-C  ?ALAYCEN 1/35 tablet Take 1 tablet by mouth daily. 06/19/20   [provider]  ?ferrous sulfate 325 (65 FE) MG EC tablet Take 1 tablet (325 mg total) by mouth 2 (two) times daily. 02/24/18 02/24/19  Pokhrel, Rebekah ChesterfieldLaxman, MD  ?ibuprofen (ADVIL) 800 MG tablet Take 1 tablet (800 mg total) by mouth every 8 (eight) hours as needed (back pain). 11/11/19   Hall-Potvin, [place], PA-C  ?Multiple Vitamins-Minerals (HAIR/SKIN/NAILS/BIOTIN) TABS Take 1 tablet by mouth daily.    [provider]  ?nystatin-triamcinolone (MYCOLOG II) cream Apply to affected area twice a day until symptoms resolve. Do not use for more than 2 weeks 11/12/20   Lurline IdolMurrill, Samantha, FNP  ? ? ?Family History ?Family History  ?Problem Relation Age of Onset  ? Healthy Mother   ? Healthy Father   ? ? ?Social History ?Social History  ? ?Tobacco Use  ? Smoking status: Never   ? Smokeless tobacco: Never  ?Vaping Use  ? Vaping Use: Never used  ?Substance Use Topics  ? Alcohol use: Never  ? Drug use: Never  ? ? ? ?Allergies   ?Patient has no known allergies. ? ? ?Review of Systems ?Review of Systems  ?Constitutional:  Negative for chills, fatigue and fever.  ?HENT:  Positive for sore throat. Negative for congestion, ear pain, nosebleeds, postnasal drip, rhinorrhea, sinus pressure and sinus pain.   ?Eyes:  Negative for pain and redness.  ?Respiratory:  Negative for cough, shortness of breath and wheezing.   ?Gastrointestinal:  Negative for abdominal pain, diarrhea, nausea and vomiting.  ?Musculoskeletal:  Negative for arthralgias and myalgias.  ?Skin:  Negative for rash.  ?Neurological:  Negative for light-headedness and headaches.  ?Hematological:  Positive for adenopathy. Does not bruise/bleed easily.  ?Psychiatric/Behavioral:  Negative for confusion and sleep disturbance.   ? ? ?Physical Exam ?Triage Vital Signs ?ED Triage Vitals  ?Enc Vitals Group  ?   BP 06/30/21 0852 111/76  ?   Pulse Rate 06/30/21 0852 (!) 107  ?   Resp 06/30/21 0852 16  ?   Temp 06/30/21 0852 (!) 100.5 ?F (38.1 ?C)  ?   Temp Source 06/30/21 0852 Oral  ?   SpO2 06/30/21 0852 98 %  ?  Weight --   ?   Height --   ?   Head Circumference --   ?   Peak Flow --   ?   Pain Score 06/30/21 0853 10  ?   Pain Loc --   ?   Pain Edu? --   ?   Excl. in GC? --   ? ?No data found. ? ?Updated Vital Signs ?BP 111/76 (BP Location: Left Arm)   Pulse (!) 107   Temp (!) 100.5 ?F (38.1 ?C) (Oral)   Resp 16   SpO2 98%  ? ?Visual Acuity ?Right Eye Distance:   ?Left Eye Distance:   ?Bilateral Distance:   ? ?Right Eye Near:   ?Left Eye Near:    ?Bilateral Near:    ? ?Physical Exam ?Vitals and nursing note reviewed.  ?Constitutional:   ?   General: She is not in acute distress. ?   Appearance: Normal appearance. She is not ill-appearing.  ?HENT:  ?   Head: Normocephalic and atraumatic.  ?   Right Ear: Tympanic membrane and ear canal  normal.  ?   Left Ear: Tympanic membrane and ear canal normal.  ?   Nose: No mucosal edema, congestion or rhinorrhea.  ?   Mouth/Throat:  ?   Pharynx: Posterior oropharyngeal erythema present. No pharyngeal swelling, oropharyngeal exudate or uvula swelling.  ?   Tonsils: Tonsillar exudate present. 2+ on the right. 2+ on the left.  ?Eyes:  ?   General: No scleral icterus. ?   Extraocular Movements: Extraocular movements intact.  ?   Conjunctiva/sclera: Conjunctivae normal.  ?Cardiovascular:  ?   Rate and Rhythm: Normal rate and regular rhythm.  ?   Heart sounds: No murmur heard. ?Pulmonary:  ?   Effort: Pulmonary effort is normal. No respiratory distress.  ?   Breath sounds: Normal breath sounds. No wheezing or rales.  ?Musculoskeletal:  ?   Cervical back: Normal range of motion. No rigidity.  ?Lymphadenopathy:  ?   Cervical: Cervical adenopathy present.  ?   Right cervical: Superficial cervical adenopathy present. No posterior cervical adenopathy. ?   Left cervical: Superficial cervical adenopathy present. No posterior cervical adenopathy.  ?Skin: ?   Capillary Refill: Capillary refill takes less than 2 seconds.  ?   Coloration: Skin is not jaundiced.  ?   Findings: No rash.  ?Neurological:  ?   General: No focal deficit present.  ?   Mental Status: She is alert and oriented to person, place, and time.  ?   Motor: No weakness.  ?   Gait: Gait normal.  ?Psychiatric:     ?   Mood and Affect: Mood normal.     ?   Behavior: Behavior normal.  ? ? ? ?UC Treatments / Results  ?Labs ?(all labs ordered are listed, but only abnormal results are displayed) ?Labs Reviewed  ?POCT RAPID STREP A (OFFICE)  ? ? ?EKG ? ? ?Radiology ?No results found. ? ?Procedures ?Procedures (including critical care time) ? ?Medications Ordered in UC ?Medications - No data to display ? ?Initial Impression / Assessment and Plan / UC Course  ?I have reviewed the triage vital signs and the nursing notes. ? ?Pertinent labs & imaging results that were  available during my care of the patient were reviewed by me and considered in my medical decision making (see chart for details). ? ?  ? ?Sx consistent with strep, and she was exposed to strep ?Despite negative strep test, I will treat for  strep ?Follow up with PCP or return if no improvement ?No signs of peritonsillar abscess ?Final Clinical Impressions(s) / UC Diagnoses  ? ?Final diagnoses:  ?Streptococcal sore throat  ? ? ? ?Discharge Instructions   ? ?  ?Take medication as prescribed ?Discard toothbrush on day 3 ? ? ? ?ED Prescriptions   ? ? Medication Sig Dispense Auth. Provider  ? penicillin v potassium (VEETID) 500 MG tablet Take 1 tablet (500 mg total) by mouth in the morning and at bedtime for 7 days. 14 tablet Evern Core, PA-C  ? ?  ? ?PDMP not reviewed this encounter. ?  ?Evern Core, PA-C ?06/30/21 [id number] ? ?

## 2021-06-30 NOTE — ED Notes (Signed)
Offered tylenol for fever, patient refused states she will take once she gets home.  ?

## 2021-06-30 NOTE — ED Triage Notes (Signed)
Patient presents to Urgent Care with complaints of fever, sore throat, nasal congestion, and headache since Weds.Treating symptoms with ibuprofen and tylenol.  ? ? ?

## 2021-11-13 ENCOUNTER — Ambulatory Visit: Payer: Self-pay | Admitting: Nurse Practitioner

## 2022-01-15 DIAGNOSIS — Z3041 Encounter for surveillance of contraceptive pills: Secondary | ICD-10-CM | POA: Diagnosis not present

## 2022-02-05 ENCOUNTER — Encounter: Payer: Self-pay | Admitting: Nurse Practitioner

## 2022-02-11 NOTE — Telephone Encounter (Signed)
If scheduled for a problem visit the pap will be scheduled with her AEX

## 2022-02-12 NOTE — Telephone Encounter (Signed)
Okay to provide patient with your NPI number?

## 2022-02-13 NOTE — Telephone Encounter (Signed)
yes

## 2022-02-14 ENCOUNTER — Encounter: Payer: Self-pay | Admitting: Radiology

## 2022-02-14 ENCOUNTER — Ambulatory Visit: Payer: Medicaid Other | Admitting: Radiology

## 2022-02-14 VITALS — BP 102/64 | Ht 68.0 in | Wt 224.0 lb

## 2022-02-14 DIAGNOSIS — N921 Excessive and frequent menstruation with irregular cycle: Secondary | ICD-10-CM

## 2022-02-14 DIAGNOSIS — Z113 Encounter for screening for infections with a predominantly sexual mode of transmission: Secondary | ICD-10-CM

## 2022-02-14 MED ORDER — NORETHIN-ETH ESTRAD-FE BIPHAS 1 MG-10 MCG / 10 MCG PO TABS: 1.0000 | ORAL_TABLET | Freq: Every day | ORAL | 0 refills | Status: DC

## 2022-02-14 NOTE — Progress Notes (Signed)
   Sydny Schnitzler [birth year]   History:  26 y.o. G0 presents as a new patient with c/o heavy menses since menarche. BTB on Junel 1.5/30. Had unprotected sex last month, elects STI screening. Some d/c.  Gynecologic History No LMP recorded. (Menstrual status: Oral contraceptives). Period Duration (Days): 7 Menstrual Flow: Heavy Dysmenorrhea: (!) Severe Dysmenorrhea Symptoms: Cramping Contraception/Family planning: OCP (estrogen/progesterone) Sexually active: yes Last Pap: ?2019   Obstetric History OB History  Gravida Para Term Preterm AB Living  0 0 0 0 0 0  SAB IAB Ectopic Multiple Live Births  0 0 0 0 0     The following portions of the patient's history were reviewed and updated as appropriate: allergies, current medications, past family history, past medical history, past social history, past surgical history, and problem list.  Review of Systems Pertinent items noted in HPI and remainder of comprehensive ROS otherwise negative.   Past medical history, past surgical history, family history and social history were all reviewed and documented in the EPIC chart.   Exam:  Vitals:   02/14/22 0949  BP: 102/64  Weight: 224 lb (101.6 kg)  Height: 5\' 8"  (1.727 m)   Body mass index is 34.06 kg/m.  General appearance:  Normal Thyroid:  Symmetrical, normal in size, without palpable masses or nodularity. Respiratory  Auscultation:  Clear without wheezing or rhonchi Cardiovascular  Auscultation:  Regular rate, without rubs, murmurs or gallops  Edema/varicosities:  Not grossly evident Abdominal  Soft,nontender, without masses, guarding or rebound.  Liver/spleen:  No organomegaly noted  Hernia:  None appreciated  Skin  Inspection:  Grossly normal Genitourinary   Inguinal/mons:  Normal without inguinal adenopathy  External genitalia:  Normal appearing vulva with no masses, tenderness, or lesions  BUS/Urethra/Skene's glands:  Normal without masses or  exudate  Vagina:  + vaginal discharge and scant bleeding  Cervix:  Normal appearing without discharge or lesions  Uterus:  Normal in size, shape and contour.  Mobile, nontender  Adnexa/parametria:     Rt: Normal in size, without masses or tenderness.   Lt: Normal in size, without masses or tenderness.  Anus and perineum: Normal   Patient informed chaperone available to be present for breast and pelvic exam. Patient has requested no chaperone to be present. Patient has been advised what will be completed during breast and pelvic exam.   Assessment/Plan:   1. Screening for STDs (sexually transmitted diseases)  - SureSwab Advanced Vaginitis Plus,TMA  2. Breakthrough bleeding on birth control pills  - SureSwab Advanced Vaginitis Plus,TMA - Norethindrone-Ethinyl Estradiol-Fe Biphas (LO LOESTRIN FE) 1 MG-10 MCG / 10 MCG tablet; Take 1 tablet by mouth daily.  Dispense: 84 tablet; Refill: 0     Discussed SBE, colonoscopy and DEXA screening as directed/appropriate. Recommend of exercise weekly, including weight bearing exercise. Encouraged the use of seatbelts and sunscreen. Return in 1 year for annual or as needed.   WHNP-BC 10:33 AM 02/14/2022

## 2022-02-15 LAB — SURESWAB® ADVANCED VAGINITIS PLUS,TMA
C. trachomatis RNA, TMA: NOT DETECTED
CANDIDA SPECIES: NOT DETECTED
Candida glabrata: NOT DETECTED
N. gonorrhoeae RNA, TMA: NOT DETECTED
SURESWAB(R) ADV BACTERIAL VAGINOSIS(BV),TMA: POSITIVE — AB
TRICHOMONAS VAGINALIS (TV),TMA: NOT DETECTED

## 2022-02-25 ENCOUNTER — Ambulatory Visit: Payer: Medicaid Other | Admitting: Nurse Practitioner

## 2022-02-25 ENCOUNTER — Encounter: Payer: Self-pay | Admitting: Nurse Practitioner

## 2022-02-25 VITALS — BP 118/80 | HR 71 | Temp 97.8°F | Ht 68.0 in | Wt 221.8 lb

## 2022-02-25 DIAGNOSIS — E559 Vitamin D deficiency, unspecified: Secondary | ICD-10-CM

## 2022-02-25 DIAGNOSIS — F331 Major depressive disorder, recurrent, moderate: Secondary | ICD-10-CM | POA: Diagnosis not present

## 2022-02-25 DIAGNOSIS — Z6833 Body mass index (BMI) 33.0-33.9, adult: Secondary | ICD-10-CM

## 2022-02-25 DIAGNOSIS — E6609 Other obesity due to excess calories: Secondary | ICD-10-CM | POA: Diagnosis not present

## 2022-02-25 DIAGNOSIS — D500 Iron deficiency anemia secondary to blood loss (chronic): Secondary | ICD-10-CM

## 2022-02-25 DIAGNOSIS — N83201 Unspecified ovarian cyst, right side: Secondary | ICD-10-CM | POA: Insufficient documentation

## 2022-02-25 NOTE — Patient Instructions (Addendum)
Thank you for choosing [facility]  Go to lab for blood draw  Discuss hormonal contraception with GYN. You will be contacted to schedule appt with healthy weight clinic and psychology  Calorie Counting for Weight Loss Calories are units of energy. Your body needs a certain number of calories from food to keep going throughout the day. When you eat or drink more calories than your body needs, your body stores the extra calories mostly as fat. When you eat or drink fewer calories than your body needs, your body burns fat to get the energy it needs. Calorie counting means keeping track of how many calories you eat and drink each day. Calorie counting can be helpful if you need to lose weight. If you eat fewer calories than your body needs, you should lose weight. Ask your health care provider what a healthy weight is for you. For calorie counting to work, you will need to eat the right number of calories each day to lose a healthy amount of weight per week. A dietitian can help you figure out how many calories you need in a day and will suggest ways to reach your calorie goal. A healthy amount of weight to lose each week is usually 1-2 lb (0.5-0.9 kg). This usually means that your daily calorie intake should be reduced by 500-750 calories. Eating 1,200-1,500 calories a day can help most women lose weight. Eating 1,500-1,800 calories a day can help most men lose weight. What do I need to know about calorie counting? Work with your health care provider or dietitian to determine how many calories you should get each day. To meet your daily calorie goal, you will need to: Find out how many calories are in each food that you would like to eat. Try to do this before you eat. Decide how much of the food you plan to eat. Keep a food log. Do this by writing down what you ate and how many calories it had. To successfully lose weight, it is important to balance calorie counting with a healthy lifestyle  that includes regular activity. Where do I find calorie information?  The number of calories in a food can be found on a Nutrition Facts label. If a food does not have a Nutrition Facts label, try to look up the calories online or ask your dietitian for help. Remember that calories are listed per serving. If you choose to have more than one serving of a food, you will have to multiply the calories per serving by the number of servings you plan to eat. For example, the label on a package of bread might say that a serving size is 1 slice and that there are 90 calories in a serving. If you eat 1 slice, you will have eaten 90 calories. If you eat 2 slices, you will have eaten 180 calories. How do I keep a food log? After each time that you eat, record the following in your food log as soon as possible: What you ate. Be sure to include toppings, sauces, and other extras on the food. How much you ate. This can be measured in cups, ounces, or number of items. How many calories were in each food and drink. The total number of calories in the food you ate. Keep your food log near you, such as in a pocket-sized notebook or on an app or website on your mobile phone. Some programs will calculate calories for you and show you how many calories you  have left to meet your daily goal. What are some portion-control tips? Know how many calories are in a serving. This will help you know how many servings you can have of a certain food. Use a measuring cup to measure serving sizes. You could also try weighing out portions on a kitchen scale. With time, you will be able to estimate serving sizes for some foods. Take time to put servings of different foods on your favorite plates or in your favorite bowls and cups so you know what a serving looks like. Try not to eat straight from a food's packaging, such as from a bag or box. Eating straight from the package makes it hard to see how much you are eating and can lead to  overeating. Put the amount you would like to eat in a cup or on a plate to make sure you are eating the right portion. Use smaller plates, glasses, and bowls for smaller portions and to prevent overeating. Try not to multitask. For example, avoid watching TV or using your computer while eating. If it is time to eat, sit down at a table and enjoy your food. This will help you recognize when you are full. It will also help you be more mindful of what and how much you are eating. What are tips for following this plan? Reading food labels Check the calorie count compared with the serving size. The serving size may be smaller than what you are used to eating. Check the source of the calories. Try to choose foods that are high in protein, fiber, and vitamins, and low in saturated fat, trans fat, and sodium. Shopping Read nutrition labels while you shop. This will help you make healthy decisions about which foods to buy. Pay attention to nutrition labels for low-fat or fat-free foods. These foods sometimes have the same number of calories or more calories than the full-fat versions. They also often have added sugar, starch, or salt to make up for flavor that was removed with the fat. Make a grocery list of lower-calorie foods and stick to it. Cooking Try to cook your favorite foods in a healthier way. For example, try baking instead of frying. Use low-fat dairy products. Meal planning Use more fruits and vegetables. One-half of your plate should be fruits and vegetables. Include lean proteins, such as chicken, [place], and fish. Lifestyle Each week, aim to do one of the following: 150 minutes of moderate exercise, such as walking. 75 minutes of vigorous exercise, such as running. General information Know how many calories are in the foods you eat most often. This will help you calculate calorie counts faster. Find a way of tracking calories that works for you. Get creative. Try different apps or  programs if writing down calories does not work for you. What foods should I eat?  Eat nutritious foods. It is better to have a nutritious, high-calorie food, such as an avocado, than a food with few nutrients, such as a bag of potato chips. Use your calories on foods and drinks that will fill you up and will not leave you hungry soon after eating. Examples of foods that fill you up are nuts and nut butters, vegetables, lean proteins, and high-fiber foods such as whole grains. High-fiber foods are foods with more than 5 g of fiber per serving. Pay attention to calories in drinks. Low-calorie drinks include water and unsweetened drinks. The items listed above may not be a complete list of foods and beverages you can  eat. Contact a dietitian for more information. What foods should I limit? Limit foods or drinks that are not good sources of vitamins, minerals, or protein or that are high in unhealthy fats. These include: Candy. Other sweets. Sodas, specialty coffee drinks, alcohol, and juice. The items listed above may not be a complete list of foods and beverages you should avoid. Contact a dietitian for more information. How do I count calories when eating out? Pay attention to portions. Often, portions are much larger when eating out. Try these tips to keep portions smaller: Consider sharing a meal instead of getting your own. If you get your own meal, eat only half of it. Before you start eating, ask for a container and put half of your meal into it. When available, consider ordering smaller portions from the menu instead of full portions. Pay attention to your food and drink choices. Knowing the way food is cooked and what is included with the meal can help you eat fewer calories. If calories are listed on the menu, choose the lower-calorie options. Choose dishes that include vegetables, fruits, whole grains, low-fat dairy products, and lean proteins. Choose items that are boiled, broiled,  grilled, or steamed. Avoid items that are buttered, battered, fried, or served with cream sauce. Items labeled as crispy are usually fried, unless stated otherwise. Choose water, low-fat milk, unsweetened iced tea, or other drinks without added sugar. If you want an alcoholic beverage, choose a lower-calorie option, such as a glass of wine or light beer. Ask for dressings, sauces, and syrups on the side. These are usually high in calories, so you should limit the amount you eat. If you want a salad, choose a garden salad and ask for grilled meats. Avoid extra toppings such as bacon, cheese, or fried items. Ask for the dressing on the side, or ask for olive oil and vinegar or lemon to use as dressing. Estimate how many servings of a food you are given. Knowing serving sizes will help you be aware of how much food you are eating at restaurants. Where to find more information Centers for Disease Control and Prevention: FootballExhibition.com.br U.S. Department of Agriculture: WrestlingReporter.dk Summary Calorie counting means keeping track of how many calories you eat and drink each day. If you eat fewer calories than your body needs, you should lose weight. A healthy amount of weight to lose per week is usually 1-2 lb (0.5-0.9 kg). This usually means reducing your daily calorie intake by 500-750 calories. The number of calories in a food can be found on a Nutrition Facts label. If a food does not have a Nutrition Facts label, try to look up the calories online or ask your dietitian for help. Use smaller plates, glasses, and bowls for smaller portions and to prevent overeating. Use your calories on foods and drinks that will fill you up and not leave you hungry shortly after a meal. This information is not intended to replace advice given to you by your health care provider. Make sure you discuss any questions you have with your health care provider. Document Revised: 04/14/2019 Document Reviewed: 04/14/2019 Elsevier Patient  Education  2023 Elsevier Inc.  How to Increase Your Level of Physical Activity Getting regular physical activity is important for your overall health and well-being. Most people do not get enough exercise. There are easy ways to increase your level of physical activity, even if you have not been very active in the past or if you are just starting out. What are  the benefits of physical activity? Physical activity has many short-term and long-term benefits. Being active on a regular basis can improve your physical and mental health as well as provide other benefits. Physical health benefits Helping you lose weight or maintain a healthy weight. Strengthening your muscles and bones. Reducing your risk of certain long-term (chronic) diseases, including heart disease, cancer, and diabetes. Being able to move around more easily and for longer periods of time without getting tired (increased endurance or stamina). Improving your ability to fight off illness (enhanced immunity). Being able to sleep better. Helping you stay healthy as you get older, including: Helping you stay mobile, or capable of walking and moving around. Preventing accidents, such as falls. Increasing life expectancy. Mental health benefits Boosting your mood and improving your self-esteem. Lowering your chance of having mental health problems, such as depression or anxiety. Helping you feel good about your body. Other benefits Finding new sources of fun and enjoyment. Meeting new people who share a common interest. Before you begin If you have a chronic illness or have not been active for a while, check with your health care provider about how to get started. Ask your health care provider what activities are safe for you. Start out slowly. Walking or doing some simple chair exercises is a good place to start, especially if you have not been active before or for a long time. Set goals that you can work toward. Ask your health  care provider how much exercise is best for you. In general, most adults should: Do moderate-intensity exercise for at least 150 minutes each week (30 minutes on most days of the week) or vigorous exercise for at least 75 minutes each week, or a combination of these. Moderate-intensity exercise can include walking at a quick pace, biking, yoga, water aerobics, or gardening. Vigorous exercise involves activities that take more effort, such as jogging or running, playing sports, swimming laps, or jumping rope. Do strength exercises on at least 2 days each week. This can include weight lifting, body weight exercises, and resistance-band exercises. How to be more physically active Make a plan  Try to find activities that you enjoy. You are more likely to commit to an exercise routine if it does not feel like a chore. If you have bone or joint problems, choose low-impact exercises, like walking or swimming. Use these tips for being successful with an exercise plan: Find a workout partner for accountability. Join a group or class, such as an aerobics class, cycling class, or sports team. Make family time active. Go for a walk, bike, or swim. Include a variety of exercises each week. Consider using a fitness tracker, such as a mobile phone app or a device worn like a watch, that will count the number of steps you take each day. Many people strive to reach 10,000 steps a day. Find ways to be active in your daily routines Besides your formal exercise plans, you can find ways to do physical activity during your daily routines, such as: Walking or biking to work or to the store. Taking the stairs instead of the elevator. Parking farther away from the door at work or at the store. Planning walking meetings. Walking around while you are on the phone. Where to find more information Centers for Disease Control and Prevention: WorkDashboard.es President's Council on Fitness, Sports & Nutrition:  www.fitness.gov ChooseMyPlate: MassVoice.es Contact a health care provider if: You have headaches, muscle aches, or joint pain that is concerning. You feel dizzy  or light-headed while exercising. You faint. You feel your heart skipping, racing, or fluttering. You have chest pain while exercising. Summary Exercise benefits your mind and body at any age, even if you are just starting out. If you have a chronic illness or have not been active for a while, check with your health care provider before increasing your physical activity. Choose activities that are safe and enjoyable for you. Ask your health care provider what activities are safe for you. Start slowly. Tell your health care provider if you have problems as you start to increase your activity level. This information is not intended to replace advice given to you by your health care provider. Make sure you discuss any questions you have with your health care provider. Document Revised: 06/29/2020 Document Reviewed: 06/29/2020 Elsevier Patient Education  [place].

## 2022-02-25 NOTE — Progress Notes (Signed)
Established Patient Visit  Patient: Terry Blackburn   DOB: [birth year]   26 y.o. Female  MRN: [id number] Visit Date: 02/25/2022  Subjective:    Chief Complaint  Patient presents with   Establish Care    New pt, est care  C/o heavy cycles but would like to stop BC Weight gain  Requesting records for pap   HPI Ms. Manka is here to establish care. She had no previous pcp. She has established care with a GYN. Today she reports hx of Menorrhagia and Obesity. Current use of COC per GYN to control vaginal bleeding. She has f/up appt in 22months. She is concerned weight gain is caused by hormonal contraception in last 59yrs Lowest weight 180lbs n 2020 Heaviest weight 225lbs Exercise:inconsistent, cardio and weight training Diet: inconsistent. Fhx of Obesity: mother  She also reports overwhelmed and anxious feeling x 1year. This is related to work stress. She admits social isolation due to work and school schedule. Denies any SI/HI/ hallucination. No hx of IVC. No drug or alcohol use. Wt Readings from Last 3 Encounters:  02/25/22 221 lb 12.8 oz (100.6 kg)  02/14/22 224 lb (101.6 kg)  09/04/18 190 lb (86.2 kg)    Reviewed medical, surgical, and social history today  Medications: Outpatient Medications Prior to Visit  Medication Sig   Norethindrone-Ethinyl Estradiol-Fe Biphas (LO LOESTRIN FE) 1 MG-10 MCG / 10 MCG tablet Take 1 tablet by mouth daily.   [DISCONTINUED] ibuprofen (ADVIL) 800 MG tablet Take 1 tablet (800 mg total) by mouth every 8 (eight) hours as needed (back pain). (Patient not taking: Reported on 02/25/2022)   No facility-administered medications prior to visit.   Reviewed past medical and social history.   ROS per HPI above      Objective:  BP 118/80 (BP Location: Right Arm, Patient Position: Sitting, Cuff Size: Normal)   Pulse 71   Temp 97.8 F (36.6 C) (Temporal)   Ht 5\' 8"  (1.727 m)   Wt 221 lb 12.8 oz (100.6 kg)   SpO2 96%    BMI 33.72 kg/m      Physical Exam Cardiovascular:     Rate and Rhythm: Normal rate.     Pulses: Normal pulses.  Pulmonary:     Effort: Pulmonary effort is normal.  Neurological:     Mental Status: She is alert and oriented to person, place, and time.  Psychiatric:        Attention and Perception: Attention normal.        Mood and Affect: Mood is anxious. Affect is tearful.        Speech: Speech normal.        Behavior: Behavior is cooperative.        Thought Content: Thought content normal.        Cognition and Memory: Cognition normal.        Judgment: Judgment normal.     No results found for any visits on 02/25/22.    Assessment & Plan:    Problem List Items Addressed This Visit       Other   Class 1 obesity due to excess calories with serious comorbidity and body mass index (BMI) of 33.0 to 33.9 in adult   Relevant Orders   TSH   Comprehensive metabolic panel   Amb Ref to Medical Weight Management   Iron deficiency anemia due to chronic blood loss - Primary   Relevant  Orders   CBC with Differential/Platelet   Iron, TIBC and Ferritin Panel   Moderate episode of recurrent major depressive disorder (HCC)   Relevant Orders   TSH   Ambulatory referral to Psychology   Other Visit Diagnoses     Vitamin D insufficiency       Relevant Orders   VITAMIN D 25 Hydroxy (Vit-D Deficiency, Fractures)      Discuss hormonal contraception with GYN. You will be contacted to schedule appt with healthy weight clinic and psychology.  Return in about 3 months (around 05/27/2022) for depression and anxiety.     Alysia Penna, NP

## 2022-02-25 NOTE — Telephone Encounter (Signed)
Results note 02/14/22 "+ BV please send rx for flagyl 500mg  po BID x 7 days or metrogel x 5 at hs "

## 2022-02-26 LAB — CBC WITH DIFFERENTIAL/PLATELET
Basophils Absolute: 0 10*3/uL (ref 0.0–0.1)
Basophils Relative: 0.8 % (ref 0.0–3.0)
Eosinophils Absolute: 0.2 10*3/uL (ref 0.0–0.7)
Eosinophils Relative: 4.4 % (ref 0.0–5.0)
HCT: 39.8 % (ref 36.0–46.0)
Hemoglobin: 13.1 g/dL (ref 12.0–15.0)
Lymphocytes Relative: 37.1 % (ref 12.0–46.0)
Lymphs Abs: 2.1 10*3/uL (ref 0.7–4.0)
MCHC: 32.9 g/dL (ref 30.0–36.0)
MCV: 88.2 fl (ref 78.0–100.0)
Monocytes Absolute: 0.4 10*3/uL (ref 0.1–1.0)
Monocytes Relative: 7.4 % (ref 3.0–12.0)
Neutro Abs: 2.8 10*3/uL (ref 1.4–7.7)
Neutrophils Relative %: 50.3 % (ref 43.0–77.0)
Platelets: 380 10*3/uL (ref 150.0–400.0)
RBC: 4.51 Mil/uL (ref 3.87–5.11)
RDW: 14.1 % (ref 11.5–15.5)
WBC: 5.5 10*3/uL (ref 4.0–10.5)

## 2022-02-26 LAB — IRON,TIBC AND FERRITIN PANEL
%SAT: 23 % (calc) (ref 16–45)
Ferritin: 3 ng/mL — ABNORMAL LOW (ref 16–154)
Iron: 116 ug/dL (ref 40–190)
TIBC: 505 mcg/dL (calc) — ABNORMAL HIGH (ref 250–450)

## 2022-02-26 LAB — COMPREHENSIVE METABOLIC PANEL
ALT: 12 U/L (ref 0–35)
AST: 14 U/L (ref 0–37)
Albumin: 4.1 g/dL (ref 3.5–5.2)
Alkaline Phosphatase: 103 U/L (ref 39–117)
BUN: 8 mg/dL (ref 6–23)
CO2: 26 mEq/L (ref 19–32)
Calcium: 9.5 mg/dL (ref 8.4–10.5)
Chloride: 100 mEq/L (ref 96–112)
Creatinine, Ser: 0.83 mg/dL (ref 0.40–1.20)
GFR: 98.04 mL/min (ref >60.00)
Glucose, Bld: 79 mg/dL (ref 70–99)
Potassium: 3.8 mEq/L (ref 3.5–5.1)
Sodium: 134 mEq/L — ABNORMAL LOW (ref 135–145)
Total Bilirubin: 0.2 mg/dL (ref 0.2–1.2)
Total Protein: 7.2 g/dL (ref 6.0–8.3)

## 2022-02-26 LAB — VITAMIN D 25 HYDROXY (VIT D DEFICIENCY, FRACTURES): VITD: 22.23 ng/mL — ABNORMAL LOW (ref 30.00–100.00)

## 2022-02-26 LAB — TSH: TSH: 1.67 u[IU]/mL (ref 0.35–5.50)

## 2022-02-26 MED ORDER — VITAMIN D (ERGOCALCIFEROL) 1.25 MG (50000 UNIT) PO CAPS: 50000.0000 [IU] | ORAL_CAPSULE | ORAL | 0 refills | Status: AC

## 2022-02-26 MED ORDER — IRON (FERROUS SULFATE) 325 (65 FE) MG PO TABS: 325.0000 mg | ORAL_TABLET | ORAL | 0 refills | Status: AC

## 2022-02-26 NOTE — Addendum Note (Signed)
Addended by: Alysia Penna L on: 02/26/2022 12:54 PM   Modules accepted: Orders

## 2022-03-20 ENCOUNTER — Ambulatory Visit: Payer: Self-pay | Admitting: Nurse Practitioner

## 2022-03-24 ENCOUNTER — Encounter (INDEPENDENT_AMBULATORY_CARE_PROVIDER_SITE_OTHER): Payer: Self-pay

## 2022-04-01 ENCOUNTER — Encounter (INDEPENDENT_AMBULATORY_CARE_PROVIDER_SITE_OTHER): Payer: Self-pay

## 2022-04-08 ENCOUNTER — Ambulatory Visit (HOSPITAL_COMMUNITY)
Admission: EM | Admit: 2022-04-08 | Discharge: 2022-04-08 | Disposition: A | Discharge status: Home / Self Care | Payer: Medicaid Other

## 2022-04-08 ENCOUNTER — Other Ambulatory Visit: Payer: Self-pay | Admitting: Nurse Practitioner

## 2022-04-08 DIAGNOSIS — B9689 Other specified bacterial agents as the cause of diseases classified elsewhere: Secondary | ICD-10-CM

## 2022-04-08 DIAGNOSIS — E559 Vitamin D deficiency, unspecified: Secondary | ICD-10-CM

## 2022-04-08 MED ORDER — METRONIDAZOLE 500 MG PO TABS: 500.0000 mg | ORAL_TABLET | Freq: Two times a day (BID) | ORAL | 0 refills | Status: DC

## 2022-04-08 NOTE — Telephone Encounter (Signed)
Yes, OK to treat now. Please offer her PO flagyl or Metrogel for treatment. Thanks.

## 2022-04-10 ENCOUNTER — Other Ambulatory Visit (HOSPITAL_COMMUNITY)
Admission: RE | Admit: 2022-04-10 | Discharge: 2022-04-10 | Disposition: A | Discharge status: Home / Self Care | Payer: Medicaid Other | Source: Ambulatory Visit | Attending: Nurse Practitioner | Admitting: Nurse Practitioner

## 2022-04-10 ENCOUNTER — Encounter: Payer: Self-pay | Admitting: Nurse Practitioner

## 2022-04-10 ENCOUNTER — Ambulatory Visit: Payer: Medicaid Other | Admitting: Nurse Practitioner

## 2022-04-10 VITALS — BP 112/64 | HR 86 | Temp 97.7°F | Ht 68.0 in | Wt 214.4 lb

## 2022-04-10 DIAGNOSIS — Z7251 High risk heterosexual behavior: Secondary | ICD-10-CM | POA: Insufficient documentation

## 2022-04-10 DIAGNOSIS — Z8619 Personal history of other infectious and parasitic diseases: Secondary | ICD-10-CM | POA: Diagnosis not present

## 2022-04-10 DIAGNOSIS — Z113 Encounter for screening for infections with a predominantly sexual mode of transmission: Secondary | ICD-10-CM | POA: Diagnosis not present

## 2022-04-10 NOTE — Patient Instructions (Signed)
You will be contacted with results  Cold Sore  A cold sore, also called a fever blister, is a small, fluid-filled sore that forms inside the mouth or on the lips, gums, nose, chin, or cheeks. Cold sores can spread to other parts of the body, such as the eyes, fingers, or genitals. Cold sores can spread from person to person (are contagious) until the sores crust over completely. Most cold sores go away within 2 weeks. What are the causes? Cold sores are caused by an infection from a common type of herpes simplex virus (HSV-1). HSV-1 is closely related to the HSV-2virus, which is the virus that causes genital herpes, but these viruses are not the same. Once a person is infected with HSV-1, the virus remains permanently in the body. HSV-1 is spread from person to person through close contact, such as through kissing, touching the affected area, or sharing personal items such as lip balm, razors, a drinking glass, or eating utensils. What increases the risk? You are more likely to develop this condition if you: Are tired, stressed, or sick. Are menstruating. Are pregnant. Take certain medicines. Are exposed to cold weather or too much sun. What are the signs or symptoms? Symptoms of a cold sore outbreak go through different stages. These are the stages of a cold sore: Tingling, itching, or burning is felt 1-2 days before the outbreak. Fluid-filled blisters appear on the lips, inside the mouth, on the nose, or on the cheeks. The blisters start to ooze clear fluid. The blisters dry up, and a yellow crust appears in their place. The crust falls off. In some cases, other symptoms can develop during a cold sore outbreak. These can include: Fever. Sore throat. Headache. Muscle aches. Swollen neck glands. How is this diagnosed? This condition is diagnosed based on your medical history and a physical exam. Your health care provider may do a blood test or may swab some fluid from your sore and then  examine the swab in the lab. How is this treated? There is no cure for cold sores or HSV-1. There is also no vaccine for HSV-1. Most cold sores go away on their own without treatment within 2 weeks. Medicines cannot make the infection go away, but your health care provider may prescribe medicines to: Help relieve some of the pain associated with the sores. Work to stop the virus from multiplying. Shorten healing time. Medicines may be in the form of creams, gels, pills, or a shot. Follow these instructions at home: Medicines Take or apply over-the-counter and prescription medicines only as told by your health care provider. Use a cotton-tip swab to apply creams or gels to your sores. Ask your health care provider if you can take lysine supplements. Research has found that lysine may help heal the cold sore faster and prevent outbreaks. Sore care  Do not touch the sores or pick the scabs. Wash your hands often with soap and water for at least 20 seconds. Do not touch your eyes without washing your hands first. Keep the sores clean and dry. If directed, put ice on the sores. To do this: Put ice in a plastic bag. Place a towel between your skin and the bag. Leave the ice on for 20 minutes, 2-3 times a day. Remove the ice if your skin turns bright red. This is very important. If you cannot feel pain, heat, or cold, you have a greater risk of damage to the area. Eating and drinking Eat a soft, bland diet.  Avoid eating hot, cold, or salty foods. Use a straw if it hurts to drink out of a glass. Eat foods that are rich in lysine, such as meat, fish, and dairy products. Avoid sugary foods, chocolates, nuts, and grains. These foods are rich in a nutrient called arginine, which can cause the virus to multiply. Lifestyle Do not kiss, have oral sex, or share personal items until your sores heal. Stress, poor sleep, and being out in the sun can trigger outbreaks. Make sure you: Do activities that  help you relax, such as deep breathing exercises or meditation. Get enough sleep. Apply sunscreen on your lips before you go out in the sun. Contact a health care provider if: You have symptoms for more than 2 weeks. You have pus coming from the sores. You have redness that is spreading. You have pain or irritation in your eye. You get sores on your genitals. Your sores do not heal within 2 weeks. You have frequent cold sore outbreaks. Get help right away if: You have a fever and your symptoms suddenly get worse. You have a headache and confusion. You have tiredness (fatigue) or loss of appetite. You have a stiff neck or sensitivity to light. Summary A cold sore, also called a fever blister, is a small, fluid-filled sore that forms inside the mouth or on the lips, gums, nose, chin, or cheeks. Most cold sores go away on their own without treatment within 2 weeks. Your health care provider may prescribe medicines to help relieve some of the pain, work to stop the virus from multiplying, and shorten healing time. Wash your hands often with soap and water for at least 20 seconds. Do not touch your eyes without washing your hands first. Do not kiss, have oral sex, or share personal items until your sores heal. Contact a health care provider if your sores do not heal within 2 weeks. This information is not intended to replace advice given to you by your health care provider. Make sure you discuss any questions you have with your health care provider. Document Revised: 12/12/2020 Document Reviewed: 12/12/2020 Elsevier Patient Education  [place].

## 2022-04-10 NOTE — Progress Notes (Signed)
                Established Patient Visit  Patient: Terry Blackburn   DOB: 1996/03/07   26 y.o. Female  MRN: [id number] Visit Date: 04/10/2022  Subjective:    Chief Complaint  Patient presents with   Acute Visit    STD Testing  No vaginal discharge or smell    HPI She reports unprotected intercourse in last 2weeks. Denies any symptoms at this time. She it request only GC/chlamydia and trichomonas test today. She denied need for HIV or RPR or hep. C test. She also reports hx of oral sore when sick. Recent outbreak last week resolved with use of Abreeva OTC.   Reviewed medical, surgical, and social history today  Medications: Outpatient Medications Prior to Visit  Medication Sig   Iron, Ferrous Sulfate, 325 (65 Fe) MG TABS Take 325 mg by mouth every other day. With food   metroNIDAZOLE (FLAGYL) 500 MG tablet Take 1 tablet (500 mg total) by mouth 2 (two) times daily.   Norethindrone-Ethinyl Estradiol-Fe Biphas (LO LOESTRIN FE) 1 MG-10 MCG / 10 MCG tablet Take 1 tablet by mouth daily.   Vitamin D, Ergocalciferol, (DRISDOL) 1.25 MG (50000 UNIT) CAPS capsule Take 1 capsule (50,000 Units total) by mouth every 7 (seven) days.   No facility-administered medications prior to visit.   Reviewed past medical and social history.   ROS per HPI above      Objective:  BP 112/64 (BP Location: Right Arm, Patient Position: Sitting, Cuff Size: Normal)   Pulse 86   Temp 97.7 F (36.5 C) (Temporal)   Ht 5\' 8"  (1.727 m)   Wt 214 lb 6.4 oz (97.3 kg)   SpO2 98%   BMI 32.60 kg/m      Physical Exam Vitals reviewed.  Cardiovascular:     Rate and Rhythm: Normal rate.     Pulses: Normal pulses.  Pulmonary:     Effort: Pulmonary effort is normal.  Neurological:     Mental Status: She is alert and oriented to person, place, and time.     No results found for any visits on 04/10/22.    Assessment & Plan:    Problem List Items Addressed This Visit       Other   H/O cold sores   Other  Visit Diagnoses     Unprotected sexual intercourse    -  Primary   Relevant Orders   Cervicovaginal ancillary only( [place])   Screen for STD (sexually transmitted disease)       Relevant Orders   Cervicovaginal ancillary only( [place])      Return in about 6 months (around 10/09/2022) for CPE (fasting).     Wilfred Lacy, NP

## 2022-04-11 LAB — CERVICOVAGINAL ANCILLARY ONLY
Chlamydia: NEGATIVE
Comment: NEGATIVE
Comment: NEGATIVE
Comment: NORMAL
Neisseria Gonorrhea: NEGATIVE
Trichomonas: NEGATIVE

## 2022-04-11 NOTE — Telephone Encounter (Signed)
No answer and VM has not been set up yet. Will send mychart msg.

## 2022-04-11 NOTE — Telephone Encounter (Signed)
Please call the patient, by the sounds of her message she is taking the pill continuously? I don't see a discussion about that in the notes. Please ask how she is taking the pill, cyclically or continuously. If she is skipping the placebo pills and taking active pills daily, that can lead to breakthrough bleeding.  If she is taking the pill cyclically and is having breakthrough bleeding, that can occur from late or missed pills. It's possible her being sick could affect her cycle.  If it persists she should be seen. If there is any chance of pregnancy she should check a pregnancy test.  I see that she just had STD testing, results are pending.

## 2022-04-14 NOTE — Telephone Encounter (Signed)
Please contact pt to schedule first available office visit for irregular bleeding on BCPs. Thanks.

## 2022-04-17 ENCOUNTER — Encounter: Payer: Self-pay | Admitting: Nurse Practitioner

## 2022-04-17 ENCOUNTER — Ambulatory Visit: Payer: Medicaid Other | Admitting: Nurse Practitioner

## 2022-04-17 VITALS — BP 110/78 | HR 69

## 2022-04-17 DIAGNOSIS — N921 Excessive and frequent menstruation with irregular cycle: Secondary | ICD-10-CM | POA: Diagnosis not present

## 2022-04-17 DIAGNOSIS — N898 Other specified noninflammatory disorders of vagina: Secondary | ICD-10-CM | POA: Diagnosis not present

## 2022-04-17 DIAGNOSIS — N926 Irregular menstruation, unspecified: Secondary | ICD-10-CM | POA: Diagnosis not present

## 2022-04-17 LAB — PREGNANCY, URINE: Preg Test, Ur: NEGATIVE

## 2022-04-17 LAB — WET PREP FOR TRICH, YEAST, CLUE

## 2022-04-17 NOTE — Progress Notes (Signed)
   Acute Office Visit  Subjective:    Patient ID: Terry Blackburn, female    DOB: [birth year], 27 y.o.   MRN: [id number]   HPI 27 y.o. presents today for breakthrough bleeding on COCs. H/O heavy menses. Takes COCs continuously. Was on Junel in the past but began having breakthrough bleeding. Switched to [place] in early December. No bleeding until 04/07/22. Bleeding was heavy with clots few 5 days then tapered off to brown spotting. Bleeding stopped a few days ago. Has not missed any pills. Would like to be tested for BV and yeast today. Negative STD screening 02/14/22.    Review of Systems  Constitutional: Negative.   Genitourinary:  Positive for menstrual problem. Negative for vaginal discharge.       Objective:    Physical Exam Constitutional:      Appearance: Normal appearance.  Genitourinary:    General: Normal vulva.     Vagina: Normal.     Cervix: Normal.     Uterus: Normal.      BP 110/78   Pulse 69   LMP 04/07/2022   SpO2 99%  Wt Readings from Last 3 Encounters:  04/10/22 214 lb 6.4 oz (97.3 kg)  02/25/22 221 lb 12.8 oz (100.6 kg)  02/14/22 224 lb (101.6 kg)        Patient informed chaperone available to be present for breast and/or pelvic exam. Patient has requested no chaperone to be present. Patient has been advised what will be completed during breast and pelvic exam.    UPT negative Wet prep negative for pathogens   Assessment & Plan:   Problem List Items Addressed This Visit   None Visit Diagnoses     Breakthrough bleeding on birth control pills    -  Primary   Relevant Orders   Pregnancy, urine (Completed)   Vaginal discharge       Relevant Orders   WET PREP FOR [place], YEAST, CLUE (Completed)      Plan: Wet prep and exam normal. UPT negative. Recommend continuing current birth control as she may just need time to adjust. No longer bleeding. She is agreeable and will return if BTB continues.      Tamela Gammon DNP, 10:02 AM  04/17/2022

## 2022-04-25 ENCOUNTER — Other Ambulatory Visit: Payer: Self-pay | Admitting: Radiology

## 2022-04-25 DIAGNOSIS — N921 Excessive and frequent menstruation with irregular cycle: Secondary | ICD-10-CM

## 2022-04-28 ENCOUNTER — Encounter: Payer: Self-pay | Admitting: Nurse Practitioner

## 2022-04-28 ENCOUNTER — Other Ambulatory Visit: Payer: Self-pay

## 2022-04-28 DIAGNOSIS — N921 Excessive and frequent menstruation with irregular cycle: Secondary | ICD-10-CM

## 2022-04-28 MED ORDER — NORETHIN-ETH ESTRAD-FE BIPHAS 1 MG-10 MCG / 10 MCG PO TABS: 1.0000 | ORAL_TABLET | Freq: Every day | ORAL | 0 refills | Status: DC

## 2022-04-28 NOTE — Telephone Encounter (Signed)
02/14/22 visit with Wende Crease, NP  "Return in 1 year for annual or as needed."

## 2022-04-28 NOTE — Telephone Encounter (Signed)
I received 2 refill requests for this patient just now. I just approved Junel 1-20 but it looks like Lo Loestrin is her current COCs. Please cancel Junel and I will send in refill for Lo Loestrin instead.

## 2022-04-28 NOTE — Telephone Encounter (Signed)
From: Grier Rocher To: Office of Maria Antonia, Alice, NP Sent: 04/25/2022 6:14 PM EST Subject: Medication Renewal Request  Refills have been requested for the following medications:   Norethindrone-Ethinyl Estradiol-Fe Biphas (LO LOESTRIN FE) 1 MG-10 MCG / 10 MCG tablet Rubbie Battiest B]  Preferred pharmacy: [id number] [place] [facility] [id number] Delivery method: [employer]

## 2022-04-28 NOTE — Telephone Encounter (Signed)
Nevermind. Lo Loestrin refilled. No need to call pharmacy. Thanks.

## 2022-04-28 NOTE — Telephone Encounter (Signed)
JC pt calling to request refill on BCPs.  AEX scheduled for 05/16/2022

## 2022-05-01 ENCOUNTER — Encounter: Payer: Self-pay | Admitting: Emergency Medicine

## 2022-05-01 ENCOUNTER — Ambulatory Visit
Admission: EM | Admit: 2022-05-01 | Discharge: 2022-05-01 | Disposition: A | Discharge status: Home / Self Care | Payer: Medicaid Other | Attending: Physician Assistant | Admitting: Physician Assistant

## 2022-05-01 ENCOUNTER — Other Ambulatory Visit: Payer: Self-pay

## 2022-05-01 DIAGNOSIS — N760 Acute vaginitis: Secondary | ICD-10-CM | POA: Diagnosis not present

## 2022-05-01 LAB — POCT URINALYSIS DIP (MANUAL ENTRY)
Bilirubin, UA: NEGATIVE
Blood, UA: NEGATIVE
Glucose, UA: NEGATIVE mg/dL
Ketones, POC UA: NEGATIVE mg/dL
Nitrite, UA: NEGATIVE
Protein Ur, POC: NEGATIVE mg/dL
Spec Grav, UA: 1.015 (ref 1.010–1.025)
Urobilinogen, UA: 0.2 E.U./dL
pH, UA: 7 (ref 5.0–8.0)

## 2022-05-01 LAB — POCT URINE PREGNANCY: Preg Test, Ur: NEGATIVE

## 2022-05-01 MED ORDER — FLUCONAZOLE 150 MG PO TABS: 150.0000 mg | ORAL_TABLET | Freq: Every day | ORAL | 0 refills | Status: DC

## 2022-05-01 NOTE — ED Triage Notes (Signed)
Vaginal , watery discharge started Saturday or Sunday.  Now vaginal itching.  Now vaginal discharge is thicker, still watery, but slight yellow  Denies uti symptoms  Reports lower back pain and lower abdominal cramping.

## 2022-05-01 NOTE — Discharge Instructions (Signed)
Lab test will be completed in 48 hours.  If you do not get a call from this office that indicates the test are negative.  Log onto MyChart to review the test results when they post in 48 hours.  Advised take the Diflucan 150 mg, 1 tablet today and then may repeat in 3-5 days if needed.  Advised to increase fluid intake to try and flush the urinary system as this will help relieve any discomfort on urination.  Advised follow-up PCP or return to urgent care as needed.

## 2022-05-01 NOTE — ED Provider Notes (Signed)
[facility]    CSN: [id number] Arrival date & time: 05/01/22  1145      History   Chief Complaint Chief Complaint  Patient presents with   Vaginal Discharge    HPI Terry Blackburn is a 27 y.o. female.   27 year old female presents with vaginal discharge.  The patient indicates for the past 3 days she has been having vaginal discharge which has been clear to yellow, she indicates that she has had external itching associated with the discharge however there has been no odor.  Patient also indicates that she has had some intermittent burning on urination and some lower back pain mainly on the left side.  She indicates she has not had any fever, chills, nausea or vomiting.  Patient indicates she is home birth control, but has not had a normal period in the past 2 months.   Vaginal Discharge   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   Cysts of both ovaries 02/25/2022   Class 1 obesity due to excess calories with serious comorbidity and body mass index (BMI) of 33.0 to 33.9 in adult 02/25/2022   Moderate episode of recurrent major depressive disorder ([place]) 02/25/2022   Menorrhagia 02/22/2018   Iron deficiency anemia due to chronic blood loss 02/22/2018   Syncope 02/22/2018    Past Surgical History:  Procedure Laterality Date   CHOLECYSTECTOMY      OB History     Gravida  0   Para  0   Term  0   Preterm  0   AB  0   Living  0      SAB  0   IAB  0   Ectopic  0   Multiple  0   Live Births  0            Home Medications    Prior to Admission medications   Medication Sig Start Date End Date Taking? Authorizing Provider  fluconazole (DIFLUCAN) 150 MG tablet Take 1 tablet (150 mg total) by mouth daily. May repeat in 3-5 days if needed. 05/01/22  Yes Nyoka Lint, PA-C  Iron, Ferrous Sulfate, 325 (65 Fe) MG TABS Take 325 mg  by mouth every other day. With food 02/26/22   Nche, Charlene Brooke, NP  metroNIDAZOLE (FLAGYL) 500 MG tablet Take 1 tablet (500 mg total) by mouth 2 (two) times daily. Patient not taking: Reported on 04/17/2022 04/08/22   Tamela Gammon, NP  Norethindrone-Ethinyl Estradiol-Fe Biphas (LO LOESTRIN FE) 1 MG-10 MCG / 10 MCG tablet Take 1 tablet by mouth daily. 04/28/22   Tamela Gammon, NP  Vitamin D, Ergocalciferol, (DRISDOL) 1.25 MG (50000 UNIT) CAPS capsule Take 1 capsule (50,000 Units total) by mouth every 7 (seven) days. 02/26/22   Nche, Charlene Brooke, NP    Family History Family History  Problem Relation Age of Onset   Obesity Mother    Healthy Mother    Healthy Father     Social History Social History   Tobacco Use   Smoking status: Never    Passive exposure: Never   Smokeless tobacco: Never  Vaping Use   Vaping Use: Never used  Substance Use Topics   Alcohol use: Yes    Comment: socially   Drug use: Never     Allergies   Patient has no known allergies.   Review of  Systems Review of Systems  Genitourinary:  Positive for vaginal discharge (clear to yellow without odor).     Physical Exam Triage Vital Signs ED Triage Vitals  Enc Vitals Group     BP 05/01/22 1226 123/84     Pulse Rate 05/01/22 1226 73     Resp 05/01/22 1226 18     Temp 05/01/22 1226 98.2 F (36.8 C)     Temp Source 05/01/22 1226 Oral     SpO2 05/01/22 1226 98 %     Weight --      Height --      Head Circumference --      Peak Flow --      Pain Score 05/01/22 1224 6     Pain Loc --      Pain Edu? --      Excl. in [place]? --    No data found.  Updated Vital Signs BP 123/84 (BP Location: Left Arm)   Pulse 73   Temp 98.2 F (36.8 C) (Oral)   Resp 18   LMP 04/07/2022   SpO2 98%   Visual Acuity Right Eye Distance:   Left Eye Distance:   Bilateral Distance:    Right Eye Near:   Left Eye Near:    Bilateral Near:     Physical Exam Constitutional:      Appearance: Normal  appearance.  Abdominal:     General: Abdomen is flat. Bowel sounds are normal.     Palpations: Abdomen is soft.     Tenderness: There is no abdominal tenderness. There is no guarding or rebound.  Neurological:     Mental Status: She is alert.      UC Treatments / Results  Labs (all labs ordered are listed, but only abnormal results are displayed) Labs Reviewed  POCT URINALYSIS DIP (MANUAL ENTRY)  POCT URINE PREGNANCY  CERVICOVAGINAL ANCILLARY ONLY    EKG   Radiology No results found.  Procedures Procedures (including critical care time)  Medications Ordered in UC Medications - No data to display  Initial Impression / Assessment and Plan / UC Course  I have reviewed the triage vital signs and the nursing notes.  Pertinent labs & imaging results that were available during my care of the patient were reviewed by me and considered in my medical decision making (see chart for details).    Plan: Diagnosis will be treated with the following: 1.  Vaginitis: A.  Diflucan 150 mg, 1 tablet today and then may repeat in 3-5 days. B.  STI testing lab results are pending, treatment may be modified depending on lab results. 2.  Advised follow-up PCP or return to urgent care as needed. Final Clinical Impressions(s) / UC Diagnoses   Final diagnoses:  Vaginitis and vulvovaginitis     Discharge Instructions      Lab test will be completed in 48 hours.  If you do not get a call from this office that indicates the test are negative.  Log onto MyChart to review the test results when they post in 48 hours.  Advised take the Diflucan 150 mg, 1 tablet today and then may repeat in 3-5 days if needed.  Advised to increase fluid intake to try and flush the urinary system as this will help relieve any discomfort on urination.  Advised follow-up PCP or return to urgent care as needed.     ED Prescriptions     Medication Sig Dispense Auth. Provider   fluconazole (DIFLUCAN) 150 MG  tablet Take 1 tablet (150 mg total) by mouth daily. May repeat in 3-5 days if needed. 2 tablet Nyoka Lint, PA-C      PDMP not reviewed this encounter.   Nyoka Lint, PA-C 05/01/22 1249

## 2022-05-02 LAB — CERVICOVAGINAL ANCILLARY ONLY
Bacterial Vaginitis (gardnerella): NEGATIVE
Candida Glabrata: NEGATIVE
Candida Vaginitis: POSITIVE — AB
Chlamydia: NEGATIVE
Comment: NEGATIVE
Comment: NEGATIVE
Comment: NEGATIVE
Comment: NEGATIVE
Comment: NEGATIVE
Comment: NORMAL
Neisseria Gonorrhea: NEGATIVE
Trichomonas: NEGATIVE

## 2022-05-06 ENCOUNTER — Ambulatory Visit: Payer: Medicaid Other | Admitting: Radiology

## 2022-05-16 ENCOUNTER — Ambulatory Visit: Payer: Medicaid Other | Admitting: Radiology

## 2022-05-27 ENCOUNTER — Ambulatory Visit: Payer: Medicaid Other | Admitting: Nurse Practitioner

## 2022-05-28 ENCOUNTER — Ambulatory Visit: Payer: Medicaid Other | Admitting: Radiology

## 2022-05-28 ENCOUNTER — Other Ambulatory Visit (HOSPITAL_COMMUNITY)
Admission: RE | Admit: 2022-05-28 | Discharge: 2022-05-28 | Disposition: A | Discharge status: Home / Self Care | Payer: Medicaid Other | Source: Ambulatory Visit | Attending: Radiology | Admitting: Radiology

## 2022-05-28 ENCOUNTER — Encounter: Payer: Self-pay | Admitting: Radiology

## 2022-05-28 VITALS — BP 102/74 | Ht 68.0 in | Wt 212.0 lb

## 2022-05-28 DIAGNOSIS — Z01419 Encounter for gynecological examination (general) (routine) without abnormal findings: Secondary | ICD-10-CM | POA: Insufficient documentation

## 2022-05-28 DIAGNOSIS — N921 Excessive and frequent menstruation with irregular cycle: Secondary | ICD-10-CM | POA: Diagnosis not present

## 2022-05-28 DIAGNOSIS — Z3041 Encounter for surveillance of contraceptive pills: Secondary | ICD-10-CM

## 2022-05-28 MED ORDER — NORETHIN-ETH ESTRAD-FE BIPHAS 1 MG-10 MCG / 10 MCG PO TABS: 1.0000 | ORAL_TABLET | Freq: Every day | ORAL | 4 refills | Status: DC

## 2022-05-28 NOTE — Progress Notes (Signed)
   Terry Blackburn [birth year] [id number]   History:  27 y.o. G0 presents for annual exam. No concerns. Doing well with OCPs, needs refill.   Gynecologic History Patient's last menstrual period was 04/07/2022 (exact date).   Contraception/Family planning: OCP (estrogen/progesterone) Sexually active: yes Last Pap: ?2020. Results were: normal   Obstetric History OB History  Gravida Para Term Preterm AB Living  0 0 0 0 0 0  SAB IAB Ectopic Multiple Live Births  0 0 0 0 0     The following portions of the patient's history were reviewed and updated as appropriate: allergies, current medications, past family history, past medical history, past social history, past surgical history, and problem list.  Review of Systems Pertinent items noted in HPI and remainder of comprehensive ROS otherwise negative.   Past medical history, past surgical history, family history and social history were all reviewed and documented in the EPIC chart.   Exam:  Vitals:   05/28/22 0939  BP: 102/74  Weight: 212 lb (96.2 kg)  Height: 5\' 8"  (1.727 m)   Body mass index is 32.23 kg/m.  General appearance:  Normal Thyroid:  Symmetrical, normal in size, without palpable masses or nodularity. Respiratory  Auscultation:  Clear without wheezing or rhonchi Cardiovascular  Auscultation:  Regular rate, without rubs, murmurs or gallops  Edema/varicosities:  Not grossly evident Abdominal  Soft,nontender, without masses, guarding or rebound.  Liver/spleen:  No organomegaly noted  Hernia:  None appreciated  Skin  Inspection:  Grossly normal Breasts: Examined lying and sitting.   Right: Without masses, retractions, nipple discharge or axillary adenopathy.   Left: Without masses, retractions, nipple discharge or axillary adenopathy. Genitourinary   Inguinal/mons:  Normal without inguinal adenopathy  External genitalia:  Normal appearing vulva with no masses, tenderness, or lesions  BUS/Urethra/Skene's  glands:  Normal without masses or exudate  Vagina:  Normal appearing with normal color and discharge, no lesions  Cervix:  Normal appearing without discharge or lesions  Uterus:  Normal in size, shape and contour.  Mobile, nontender  Adnexa/parametria:     Rt: Normal in size, without masses or tenderness.   Lt: Normal in size, without masses or tenderness.  Anus and perineum: Normal   Patient informed chaperone available to be present for breast and pelvic exam. Patient has requested no chaperone to be present. Patient has been advised what will be completed during breast and pelvic exam.   Assessment/Plan:   1. Well woman exam with routine gynecological exam - Cytology - PAP( [place]) - Declines STI screen ,negative last month, has not been sexually active since then  2. Surveillance of contraceptive pill  - Norethindrone-Ethinyl Estradiol-Fe Biphas (LO LOESTRIN FE) 1 MG-10 MCG / 10 MCG tablet; Take 1 tablet by mouth daily.  Dispense: 84 tablet; Refill: 4     Discussed SBE, colonoscopy and DEXA screening as directed/appropriate. Recommend 138mins of exercise weekly, including weight bearing exercise. Encouraged the use of seatbelts and sunscreen. Return in 1 year for annual or as needed.   Rubbie Battiest B WHNP-BC 9:55 AM 05/28/2022

## 2022-06-02 LAB — CYTOLOGY - PAP: Diagnosis: NEGATIVE

## 2022-06-04 ENCOUNTER — Ambulatory Visit
Admission: EM | Admit: 2022-06-04 | Discharge: 2022-06-04 | Disposition: A | Discharge status: Home / Self Care | Payer: Medicaid Other | Attending: Family Medicine | Admitting: Family Medicine

## 2022-06-04 DIAGNOSIS — N898 Other specified noninflammatory disorders of vagina: Secondary | ICD-10-CM | POA: Insufficient documentation

## 2022-06-04 LAB — POCT URINALYSIS DIP (MANUAL ENTRY)
Bilirubin, UA: NEGATIVE
Glucose, UA: NEGATIVE mg/dL
Ketones, POC UA: NEGATIVE mg/dL
Leukocytes, UA: NEGATIVE
Nitrite, UA: NEGATIVE
Protein Ur, POC: NEGATIVE mg/dL
Spec Grav, UA: 1.01 (ref 1.010–1.025)
Urobilinogen, UA: 0.2 E.U./dL
pH, UA: 7 (ref 5.0–8.0)

## 2022-06-04 MED ORDER — METRONIDAZOLE 500 MG PO TABS: 500.0000 mg | ORAL_TABLET | Freq: Two times a day (BID) | ORAL | 0 refills | Status: DC

## 2022-06-04 NOTE — ED Triage Notes (Signed)
Pt c/o groin rash, vaginal discharge, dysuria. Onset ~ Friday

## 2022-06-04 NOTE — Discharge Instructions (Signed)
You were seen today for various issues.  I am treating you for bacterial vaginosis today with flagyl twice/day x 7 days.  The vaginal swab will be resulted tomorrow and you will be notified of results and if a change in treatment is needed.

## 2022-06-04 NOTE — ED Provider Notes (Signed)
[facility]    CSN: [id number] Arrival date & time: 06/04/22  1222      History   Chief Complaint Chief Complaint  Patient presents with   Dysuria    HPI Terry Blackburn is a 27 y.o. female.   She started with a rash Thursday/Friday last week at the groin areas. That is healing, not using any topicals.  There were several days where is was very warm and she was sweating a ton.  Yesterday she had a vagacial.  After that started vaginal d/c.  D/c is clear and thick.  Maybe white as well. No itching.  She is sexually active, no condom use.  No STD exposures.  No urinary symptoms. No pain, no urinary frequency.  No abdominal pain, fevers/chills.        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   Cysts of both ovaries 02/25/2022   Class 1 obesity due to excess calories with serious comorbidity and body mass index (BMI) of 33.0 to 33.9 in adult 02/25/2022   Moderate episode of recurrent major depressive disorder ([place]) 02/25/2022   Menorrhagia 02/22/2018   Iron deficiency anemia due to chronic blood loss 02/22/2018   Syncope 02/22/2018    Past Surgical History:  Procedure Laterality Date   CHOLECYSTECTOMY      OB History     Gravida  0   Para  0   Term  0   Preterm  0   AB  0   Living  0      SAB  0   IAB  0   Ectopic  0   Multiple  0   Live Births  0            Home Medications    Prior to Admission medications   Medication Sig Start Date End Date Taking? Authorizing Provider  Iron, Ferrous Sulfate, 325 (65 Fe) MG TABS Take 325 mg by mouth every other day. With food 02/26/22   Nche, Charlene Brooke, NP  Norethindrone-Ethinyl Estradiol-Fe Biphas (LO LOESTRIN FE) 1 MG-10 MCG / 10 MCG tablet Take 1 tablet by mouth daily. 05/28/22   Chrzanowski, Jami B, NP  Vitamin D, Ergocalciferol, (DRISDOL) 1.25 MG (50000 UNIT) CAPS  capsule Take 1 capsule (50,000 Units total) by mouth every 7 (seven) days. 02/26/22   Nche, Charlene Brooke, NP    Family History Family History  Problem Relation Age of Onset   Obesity Mother    Healthy Mother    Healthy Father     Social History Social History   Tobacco Use   Smoking status: Never    Passive exposure: Never   Smokeless tobacco: Never  Vaping Use   Vaping Use: Never used  Substance Use Topics   Alcohol use: Yes    Comment: socially   Drug use: Never     Allergies   Patient has no known allergies.   Review of Systems Review of Systems  Constitutional: Negative.   HENT: Negative.    Respiratory: Negative.    Cardiovascular: Negative.   Gastrointestinal: Negative.   Genitourinary:  Positive for dysuria and vaginal discharge.  Skin:  Positive for rash.  Psychiatric/Behavioral: Negative.       Physical Exam Triage Vital Signs ED Triage Vitals [06/04/22 1253]  Enc Vitals Group     BP 123/84  Pulse Rate 80     Resp 16     Temp 98.2 F (36.8 C)     Temp Source Oral     SpO2 99 %     Weight      Height      Head Circumference      Peak Flow      Pain Score 0     Pain Loc      Pain Edu?      Excl. in [place]?    No data found.  Updated Vital Signs BP 123/84 (BP Location: Left Arm)   Pulse 80   Temp 98.2 F (36.8 C) (Oral)   Resp 16   LMP 04/07/2022 (Exact Date) Comment: amenorrhea with Lo Loestrin  SpO2 99%   Visual Acuity Right Eye Distance:   Left Eye Distance:   Bilateral Distance:    Right Eye Near:   Left Eye Near:    Bilateral Near:     Physical Exam Constitutional:      Appearance: Normal appearance.  Cardiovascular:     Rate and Rhythm: Normal rate and regular rhythm.  Pulmonary:     Effort: Pulmonary effort is normal.     Breath sounds: Normal breath sounds.  Genitourinary:    Comments: Slight white thin d/c on speculum exam Skin:    Comments: Slight area of irritation at the right groin;    Neurological:      General: No focal deficit present.     Mental Status: She is alert.  Psychiatric:        Mood and Affect: Mood normal.      UC Treatments / Results  Labs (all labs ordered are listed, but only abnormal results are displayed) Labs Reviewed  POCT URINALYSIS DIP (MANUAL ENTRY) - Abnormal; Notable for the following components:      Result Value   Color, UA colorless (*)    Blood, UA trace-intact (*)    All other components within normal limits  CERVICOVAGINAL ANCILLARY ONLY    EKG   Radiology No results found.  Procedures Procedures (including critical care time)  Medications Ordered in UC Medications - No data to display  Initial Impression / Assessment and Plan / UC Course  I have reviewed the triage vital signs and the nursing notes.  Pertinent labs & imaging results that were available during my care of the patient were reviewed by me and considered in my medical decision making (see chart for details).   Final Clinical Impressions(s) / UC Diagnoses   Final diagnoses:  Vaginal discharge     Discharge Instructions      You were seen today for various issues.  I am treating you for bacterial vaginosis today with flagyl twice/day x 7 days.  The vaginal swab will be resulted tomorrow and you will be notified of results and if a change in treatment is needed.      ED Prescriptions     Medication Sig Dispense Auth. Provider   metroNIDAZOLE (FLAGYL) 500 MG tablet Take 1 tablet (500 mg total) by mouth 2 (two) times daily. 14 tablet Rondel Oh, MD      PDMP not reviewed this encounter.   Rondel Oh, MD 06/04/22 1320

## 2022-06-05 ENCOUNTER — Telehealth: Payer: Medicaid Other | Admitting: Physician Assistant

## 2022-06-05 DIAGNOSIS — N760 Acute vaginitis: Secondary | ICD-10-CM

## 2022-06-05 LAB — CERVICOVAGINAL ANCILLARY ONLY
Bacterial Vaginitis (gardnerella): NEGATIVE
Candida Glabrata: NEGATIVE
Candida Vaginitis: POSITIVE — AB
Chlamydia: NEGATIVE
Comment: NEGATIVE
Comment: NEGATIVE
Comment: NEGATIVE
Comment: NEGATIVE
Comment: NEGATIVE
Comment: NORMAL
Neisseria Gonorrhea: NEGATIVE
Trichomonas: NEGATIVE

## 2022-06-05 MED ORDER — FLUCONAZOLE 150 MG PO TABS: ORAL_TABLET | ORAL | 0 refills | Status: DC

## 2022-06-05 NOTE — Progress Notes (Addendum)
Patients results from UC came in. Negative for BV. Positive for candida. Will have her stop Flagyl. Diflucan per orders. Follow-up in person if not resolving.    To patient -- Since your results are in an I can verify them, I will treat for a yeast infection. You should stop the Flagyl given for suspected BV as you tested negative for that. I am sending in Diflucan to take by mouth once, repeating in 3 days for any residual symptoms. If not resolving, you will need to follow-up in person. Take care and feel better soon!

## 2022-06-05 NOTE — Progress Notes (Signed)
Because you were seen yesterday for vaginal symptoms, started on treatment for BV, and have testing that was done, I feel we need to wait until those results are in (likely will come in later today) before making any further changes in treatment. The practice you were seen at will call you with results once they are available. If you do not hear from them by mid morning, please let us know.    NOTE: There will be NO CHARGE for this eVisit   If you are having a true medical emergency please call 911.      For an urgent face to face visit, [facility] has eight urgent care centers for your convenience:   NEW!! [place] [facility] [place] Get Driving Directions [phone] [address] [place], [place] [facility] [place] [facility] [place] Get Driving Directions [id number] [place], [place] [address]   [place] [facility] [place] [facility]) Get Driving Directions [phone] [address], [place] [address]  [place] [facility] [place] ([place]) Get Driving Directions [id number] [address] [place],  [place]  [address]  [place] [facility] at [place] Get Driving Directions  [phone] [address] [place],  [place] [address]   [place] [facility] at [facility] Get Driving Directions [id number] [place], [place], [place] [address]   [place] [facility] at [facility] Get Driving Directions  [id number] [address] [place], [place] [address]   [place] [facility] at [place] Get Driving Directions [id number] [address] [place], [place] [address]  Your MyChart E-visit questionnaire answers were reviewed by a board certified advanced clinical practitioner to complete your personal care plan based on your specific  symptoms.  Thank you for using e-Visits.

## 2022-06-05 NOTE — Addendum Note (Signed)
Addended by: Brunetta Jeans on: 06/05/2022 07:22 PM   Modules accepted: Orders

## 2022-07-01 ENCOUNTER — Ambulatory Visit: Payer: Medicaid Other | Admitting: Radiology

## 2022-07-04 ENCOUNTER — Other Ambulatory Visit: Payer: Self-pay | Admitting: Nurse Practitioner

## 2022-07-04 DIAGNOSIS — E559 Vitamin D deficiency, unspecified: Secondary | ICD-10-CM

## 2022-07-07 ENCOUNTER — Encounter: Payer: Self-pay | Admitting: Nurse Practitioner

## 2022-07-07 NOTE — Telephone Encounter (Signed)
Duplicate request, see MyChart message dated 07/07/22.   Encounter closed.

## 2022-07-17 ENCOUNTER — Telehealth: Payer: Medicaid Other | Admitting: Physician Assistant

## 2022-07-17 DIAGNOSIS — B3731 Acute candidiasis of vulva and vagina: Secondary | ICD-10-CM

## 2022-07-17 MED ORDER — FLUCONAZOLE 150 MG PO TABS: ORAL_TABLET | ORAL | 0 refills | Status: DC

## 2022-07-17 NOTE — Progress Notes (Signed)

## 2022-07-17 NOTE — Progress Notes (Signed)
I have spent 5 minutes in review of e-visit questionnaire, review and updating patient chart, medical decision making and response to patient.   Shem Plemmons Cody Rocky Gladden, PA-C    

## 2022-08-04 ENCOUNTER — Ambulatory Visit: Payer: Medicaid Other | Admitting: Nurse Practitioner

## 2022-08-15 ENCOUNTER — Ambulatory Visit: Payer: Medicaid Other | Admitting: Radiology

## 2022-08-15 VITALS — BP 114/76

## 2022-08-15 DIAGNOSIS — N9089 Other specified noninflammatory disorders of vulva and perineum: Secondary | ICD-10-CM

## 2022-08-15 DIAGNOSIS — N939 Abnormal uterine and vaginal bleeding, unspecified: Secondary | ICD-10-CM | POA: Diagnosis not present

## 2022-08-15 DIAGNOSIS — Z113 Encounter for screening for infections with a predominantly sexual mode of transmission: Secondary | ICD-10-CM

## 2022-08-15 LAB — PREGNANCY, URINE: Preg Test, Ur: NEGATIVE

## 2022-08-15 NOTE — Progress Notes (Signed)
      Subjective: Terry Blackburn is a 27 y.o. female who complains of 5 days cycles on LoLoestrin. 3 days of red blood then 2 days of brown. Does not get naturally lubricated well but has a good desire/drive. Has not tried lubricants yet. Has a lesion on the right vulva that is tender, has been there for 5 days.      Review of Systems  All other systems reviewed and are negative.   Past Medical History:  Diagnosis Date   Iron deficiency anemia 02/2018      Objective:  Today's Vitals   08/15/22 1053  BP: 114/76   There is no height or weight on file to calculate BMI.   Physical Exam Vitals and nursing note reviewed. Exam conducted with a chaperone present.  Constitutional:      Appearance: She is normal weight.  Pulmonary:     Effort: Pulmonary effort is normal.  Genitourinary:    Labia:        Right: Lesion present.      Vagina: Normal.     Cervix: Normal.     Uterus: Normal.      Adnexa: Right adnexa normal.    Neurological:     Mental Status: She is alert.  Psychiatric:        Mood and Affect: Mood normal.        Thought Content: Thought content normal.        Judgment: Judgment normal.       Raynelle Fanning, CMA present for exam  Assessment:/Plan:   1. Abnormal uterine bleeding Reassured 3-5 days periods are normal with OCPs Does not have any BTB Discussed other BC options, considering nuva ring, will message [place] if she wants to change - Pregnancy, urine  2. Screening for STDs (sexually transmitted diseases) - SURESWAB CT/NG/T. vaginalis  3. Vulvar lesion - SureSwab HSV, Type 1/2 DNA, PCR    Will contact patient with results of testing completed today. Avoid intercourse until symptoms are resolved. Safe sex encouraged. Avoid the use of soaps or perfumed products in the peri area. Avoid tub baths and sitting in sweaty or wet clothing for prolonged periods of time.

## 2022-08-16 LAB — SURESWAB CT/NG/T. VAGINALIS
C. trachomatis RNA, TMA: NOT DETECTED
N. gonorrhoeae RNA, TMA: NOT DETECTED
Trichomonas vaginalis RNA: NOT DETECTED

## 2022-08-17 LAB — SURESWAB HSV, TYPE 1/2 DNA, PCR
HSV 1 DNA: NOT DETECTED
HSV 2 DNA: NOT DETECTED

## 2022-08-29 ENCOUNTER — Telehealth: Payer: Self-pay

## 2022-08-29 ENCOUNTER — Encounter: Payer: Self-pay | Admitting: Radiology

## 2022-08-29 ENCOUNTER — Ambulatory Visit: Payer: Medicaid Other | Admitting: Radiology

## 2022-08-29 VITALS — BP 118/78

## 2022-08-29 DIAGNOSIS — B3731 Acute candidiasis of vulva and vagina: Secondary | ICD-10-CM

## 2022-08-29 LAB — WET PREP FOR TRICH, YEAST, CLUE

## 2022-08-29 MED ORDER — FLUCONAZOLE 150 MG PO TABS: 150.0000 mg | ORAL_TABLET | ORAL | 0 refills | Status: DC

## 2022-08-29 MED ORDER — NYSTATIN-TRIAMCINOLONE 100000-0.1 UNIT/GM-% EX OINT: 1.0000 | TOPICAL_OINTMENT | Freq: Two times a day (BID) | CUTANEOUS | 0 refills | Status: AC

## 2022-08-29 NOTE — Progress Notes (Signed)
      Subjective: Terry Blackburn is a 27 y.o. female who complains of vulvar redness and irritation using aquaphor with little relief.   Review of Systems  All other systems reviewed and are negative.   Past Medical History:  Diagnosis Date   Iron deficiency anemia 02/2018      Objective:  Today's Vitals   08/29/22 0843  BP: 118/78   There is no height or weight on file to calculate BMI.   -General: no acute distress -Vulva: erythema lower labia and introitus -Vagina: discharge present, wet prep obtained -Cervix: no lesion or discharge, no CMT -Perineum: no lesions -Uterus: Mobile, non tender -Adnexa: no masses or tenderness   Microscopic wet-mount exam shows hyphae.   Raynelle Fanning, CMA present for exam  Assessment:/Plan:   1. Yeast vaginitis - nystatin-triamcinolone ointment (MYCOLOG); Apply 1 Application topically 2 (two) times daily.  Dispense: 30 g; Refill: 0 - fluconazole (DIFLUCAN) 150 MG tablet; Take 1 tablet (150 mg total) by mouth every 3 (three) days.  Dispense: 2 tablet; Refill: 0 - WET PREP FOR TRICH, YEAST, CLUE    Avoid intercourse until symptoms are resolved. Safe sex encouraged. Avoid the use of soaps or perfumed products in the peri area. Avoid tub baths and sitting in sweaty or wet clothing for prolonged periods of time.

## 2022-08-29 NOTE — Telephone Encounter (Signed)
Prior Authorization request received for nystatin/triamcinolone ointment from [employer].   KEY:  [id number] DX:  B37.3 yeast vaginitis. PA approved.

## 2022-10-09 ENCOUNTER — Encounter: Payer: Self-pay | Admitting: Nurse Practitioner

## 2022-12-10 ENCOUNTER — Telehealth: Payer: Self-pay | Admitting: Family Medicine

## 2022-12-10 DIAGNOSIS — J029 Acute pharyngitis, unspecified: Secondary | ICD-10-CM

## 2022-12-10 MED ORDER — LIDOCAINE VISCOUS HCL 2 % MT SOLN: 15.0000 mL | OROMUCOSAL | 0 refills | Status: AC | PRN

## 2022-12-10 MED ORDER — AMOXICILLIN 500 MG PO TABS: 500.0000 mg | ORAL_TABLET | Freq: Two times a day (BID) | ORAL | 0 refills | Status: AC

## 2022-12-10 NOTE — Progress Notes (Signed)
E-Visit for Sore Throat - Strep Symptoms  We are sorry that you are not feeling well.  Here is how we plan to help!  Based on what you have shared with me it is likely that you have strep pharyngitis.  Strep pharyngitis is inflammation and infection in the back of the throat.  This is an infection cause by bacteria and is treated with antibiotics.  I have prescribed Amoxicillin 500 mg twice a day for 10 days and 2% Viscous Lidocaine 5 ml gargle and swallow every 4 hours as needed for throat pain. For throat pain, we recommend over the counter oral pain relief medications such as acetaminophen or aspirin, or anti-inflammatory medications such as ibuprofen or naproxen sodium. Topical treatments such as oral throat lozenges or sprays may be used as needed. Strep infections are not as easily transmitted as other respiratory infections, however we still recommend that you avoid close contact with loved ones, especially the very young and elderly.  Remember to wash your hands thoroughly throughout the day as this is the number one way to prevent the spread of infection and wipe down door knobs and counters with disinfectant.   Home Care: Only take medications as instructed by your medical team. Complete the entire course of an antibiotic. Do not take these medications with alcohol. A steam or ultrasonic humidifier can help congestion.  You can place a towel over your head and breathe in the steam from hot water coming from a faucet. Avoid close contacts especially the very young and the elderly. Cover your mouth when you cough or sneeze. Always remember to wash your hands.  Get Help Right Away If: You develop worsening fever or sinus pain. You develop a severe head ache or visual changes. Your symptoms persist after you have completed your treatment plan.  Make sure you Understand these instructions. Will watch your condition. Will get help right away if you are not doing well or get  worse.   Thank you for choosing an e-visit.  Your e-visit answers were reviewed by a board certified advanced clinical practitioner to complete your personal care plan. Depending upon the condition, your plan could have included both over the counter or prescription medications.  Please review your pharmacy choice. Make sure the pharmacy is open so you can pick up prescription now. If there is a problem, you may contact your provider through [employer] and have the prescription routed to another pharmacy.  Your safety is important to [place]. If you have drug allergies check your prescription carefully.   For the next 24 hours you can use MyChart to ask questions about today's visit, request a non-urgent call back, or ask for a work or school excuse. You will get an email in the next two days asking about your experience. I hope that your e-visit has been valuable and will speed your recovery.  I provided 5 minutes of non face-to-face time during this encounter for chart review, medication and order placement, as well as and documentation.

## 2022-12-10 NOTE — Telephone Encounter (Signed)
Hi Terry Blackburn,   There is no interaction between Plan B and Amoxicillin.  You may take this antibiotic.   Conley Simmonds, MD (I am covering for Jami who is away from the office today.)

## 2023-03-25 ENCOUNTER — Telehealth: Payer: Self-pay | Admitting: Family Medicine

## 2023-03-25 ENCOUNTER — Telehealth: Payer: Self-pay | Admitting: Emergency Medicine

## 2023-03-25 DIAGNOSIS — N898 Other specified noninflammatory disorders of vagina: Secondary | ICD-10-CM

## 2023-03-25 NOTE — Progress Notes (Signed)
 Because you have submitted a previous EV with different answers (and it was recommended you be seen in person), we can not safely treat you. Your condition warrants further evaluation and I recommend that you be seen in a face to face visit at your PCP office or local urgent care.    NOTE: There will be NO CHARGE for this eVisit   If you are having a true medical emergency please call 911.

## 2023-03-25 NOTE — Progress Notes (Signed)
 Because you have urinary symptoms plus vaginal discharge and because your discharge doesn't sound like a yeast infection, I feel your condition warrants further evaluation and I recommend that you be seen in a face to face visit. You can get an appointment with your gynecologist or try going to an urgent care.    NOTE: There will be NO CHARGE for this eVisit   If you are having a true medical emergency please call 911.      For an urgent face to face visit, [facility] has eight urgent care centers for your convenience:   NEW!! [facility] at [facility] Get Driving Directions [phone] [address] [place], [address]    [facility] at [facility] Get Driving Directions [phone] [address] [place], [place] [address]   [facility]) Get Driving Directions [phone] [address] [place], [place] [address]  [facility] Center [facility] [place]) Get Driving Directions [phone] [address] [place],  [place]  [address]  [facility] Urgent Care Center [facility] - at [employer]  [phone] [address] [place],  [place] [address]   [facility] at [facility] Get Driving Directions [phone] [address] [place], [place] [address]   [facility] at [facility] Get Driving Directions  [phone] [address] [place], [place] [address]   [facility] Health Urgent Care at [facility] [place] Directions [phone] [address], [address] [place], [place] [address]  Your MyChart E-visit questionnaire answers were reviewed by a board certified advanced clinical practitioner to complete your personal care plan based on your specific symptoms.  Thank you for using e-Visits.

## 2023-03-26 ENCOUNTER — Telehealth: Payer: Self-pay | Admitting: *Deleted

## 2023-03-26 NOTE — Telephone Encounter (Signed)
 Orlin Modest  P [facility] ClinicalYesterday (3:14 PM)    Refills have been requested for the following medications:       fluconazole  (DIFLUCAN ) 150 MG tablet [CHRZANOWSKI, JAMI B]   Preferred pharmacy: [id number] [place],  - [address] Delivery method: [employer]

## 2023-03-27 NOTE — Telephone Encounter (Signed)
 Attempted to reach pt by phone call.  No answer and no VM set up to leave VM.

## 2023-04-20 ENCOUNTER — Telehealth: Payer: Self-pay | Admitting: Nurse Practitioner

## 2023-04-20 DIAGNOSIS — B3731 Acute candidiasis of vulva and vagina: Secondary | ICD-10-CM

## 2023-04-20 MED ORDER — FLUCONAZOLE 150 MG PO TABS: 150.0000 mg | ORAL_TABLET | Freq: Once | ORAL | 0 refills | Status: AC

## 2023-04-20 NOTE — Progress Notes (Signed)

## 2023-04-23 NOTE — Telephone Encounter (Signed)
 I called patient & was told she was not at home. I left a phone number for her to call us  back

## 2023-04-29 NOTE — Telephone Encounter (Signed)
No response from patient.   Per review of EPIC, patient had E-visit with PCP on 04/20/23.   Routing to provider for final review.   Encounter closed.

## 2023-07-08 ENCOUNTER — Other Ambulatory Visit: Payer: Self-pay | Admitting: Radiology

## 2023-07-08 DIAGNOSIS — Z3041 Encounter for surveillance of contraceptive pills: Secondary | ICD-10-CM

## 2023-07-08 NOTE — Telephone Encounter (Signed)
 Med refill request: Lo Loestrin FE Last AEX: 05/28/22 Dr. Lavoie Next AEX: none scheduled Last MMG (if hormonal med) n/a Refill authorized: Lo Loestrin FE #28, needs office visit for further refills. Please approve or deny as appropriate.

## 2023-08-03 ENCOUNTER — Telehealth: Admitting: Physician Assistant

## 2023-08-03 ENCOUNTER — Encounter

## 2023-08-03 DIAGNOSIS — B3731 Acute candidiasis of vulva and vagina: Secondary | ICD-10-CM

## 2023-08-03 MED ORDER — FLUCONAZOLE 150 MG PO TABS: 150.0000 mg | ORAL_TABLET | ORAL | 0 refills | Status: DC | PRN

## 2023-08-03 NOTE — Progress Notes (Signed)

## 2023-09-28 ENCOUNTER — Telehealth

## 2023-09-28 DIAGNOSIS — B3731 Acute candidiasis of vulva and vagina: Secondary | ICD-10-CM | POA: Diagnosis not present

## 2023-09-29 MED ORDER — FLUCONAZOLE 150 MG PO TABS: 150.0000 mg | ORAL_TABLET | ORAL | 0 refills | Status: DC | PRN

## 2023-09-29 NOTE — Progress Notes (Signed)
 I have spent 5 minutes in review of e-visit questionnaire, review and updating patient chart, medical decision making and response to patient.   Piedad Climes, PA-C

## 2023-09-29 NOTE — Progress Notes (Signed)

## 2023-10-01 ENCOUNTER — Telehealth

## 2023-10-01 DIAGNOSIS — L309 Dermatitis, unspecified: Secondary | ICD-10-CM

## 2023-10-02 MED ORDER — TRIAMCINOLONE ACETONIDE 0.1 % EX CREA: 1.0000 | TOPICAL_CREAM | Freq: Two times a day (BID) | CUTANEOUS | 0 refills | Status: AC

## 2023-10-02 NOTE — Progress Notes (Signed)
 I have spent 5 minutes in review of e-visit questionnaire, review and updating patient chart, medical decision making and response to patient.   Laure Kidney, PA-C

## 2023-10-02 NOTE — Addendum Note (Signed)
 Addended byBETHA ROLAN BERTHOLD on: 10/02/2023 06:20 PM   Modules accepted: Orders

## 2023-10-02 NOTE — Progress Notes (Signed)
 E Visit for Rash  We are sorry that you are not feeling well. Here is how we plan to help!  I am prescribing triamcinolone  0.1 % cream -- apply to the affected area(s) in a thin layer, twice daily for up to 14 days. Do not apply to face, privates or armpit regions.    HOME CARE:  Take cool showers and avoid direct sunlight. Apply cool compress or wet dressings. Take a bath in an oatmeal bath.  Sprinkle content of one Aveeno packet under running faucet with comfortably warm water.  Bathe for 15-20 minutes, 1-2 times daily.  Pat dry with a towel. Do not rub the rash. Use hydrocortisone  cream. Take an antihistamine like Benadryl for widespread rashes that itch.  The adult dose of Benadryl is 25-50 mg by mouth 4 times daily. Caution:  This type of medication may cause sleepiness.  Do not drink alcohol, drive, or operate dangerous machinery while taking antihistamines.  Do not take these medications if you have prostate enlargement.  Read package instructions thoroughly on all medications that you take.  GET HELP RIGHT AWAY IF:  Symptoms don't go away after treatment. Severe itching that persists. If you rash spreads or swells. If you rash begins to smell. If it blisters and opens or develops a yellow-brown crust. You develop a fever. You have a sore throat. You become short of breath.  MAKE SURE YOU:  Understand these instructions. Will watch your condition. Will get help right away if you are not doing well or get worse.  Thank you for choosing an e-visit.  Your e-visit answers were reviewed by a board certified advanced clinical practitioner to complete your personal care plan. Depending upon the condition, your plan could have included both over the counter or prescription medications.  Please review your pharmacy choice. Make sure the pharmacy is open so you can pick up prescription now. If there is a problem, you may contact your provider through [employer] and have the  prescription routed to another pharmacy.  Your safety is important to us . If you have drug allergies check your prescription carefully.   For the next 24 hours you can use MyChart to ask questions about today's visit, request a non-urgent call back, or ask for a work or school excuse. You will get an email in the next two days asking about your experience. I hope that your e-visit has been valuable and will speed your recovery.

## 2023-12-23 ENCOUNTER — Telehealth: Admitting: Physician Assistant

## 2023-12-23 DIAGNOSIS — B3731 Acute candidiasis of vulva and vagina: Secondary | ICD-10-CM | POA: Diagnosis not present

## 2023-12-23 DIAGNOSIS — N760 Acute vaginitis: Secondary | ICD-10-CM | POA: Diagnosis not present

## 2023-12-23 MED ORDER — FLUCONAZOLE 150 MG PO TABS: 150.0000 mg | ORAL_TABLET | ORAL | 0 refills | Status: DC | PRN

## 2023-12-23 NOTE — Progress Notes (Signed)

## 2024-01-30 ENCOUNTER — Telehealth: Admitting: Physician Assistant

## 2024-01-30 DIAGNOSIS — B3731 Acute candidiasis of vulva and vagina: Secondary | ICD-10-CM | POA: Diagnosis not present

## 2024-01-30 MED ORDER — FLUCONAZOLE 150 MG PO TABS: 150.0000 mg | ORAL_TABLET | Freq: Every day | ORAL | 0 refills | Status: DC

## 2024-01-30 NOTE — Progress Notes (Signed)

## 2024-04-19 ENCOUNTER — Telehealth: Admitting: Physician Assistant

## 2024-04-19 DIAGNOSIS — B3731 Acute candidiasis of vulva and vagina: Secondary | ICD-10-CM

## 2024-04-19 MED ORDER — FLUCONAZOLE 150 MG PO TABS: ORAL_TABLET | ORAL | 0 refills | Status: AC

## 2024-04-19 NOTE — Progress Notes (Signed)
